# Patient Record
Sex: Female | Born: 2000 | Race: Black or African American | Hispanic: No | Marital: Single | Smoking: Never smoker
Health system: Southern US, Community
[De-identification: ages and names within clinical notes are randomized; demographics above are authoritative.]

## PROBLEM LIST (undated history)

## (undated) ENCOUNTER — Inpatient Hospital Stay (HOSPITAL_COMMUNITY): Payer: Self-pay

## (undated) DIAGNOSIS — Z789 Other specified health status: Secondary | ICD-10-CM

## (undated) HISTORY — PX: OTHER SURGICAL HISTORY: SHX169

## (undated) HISTORY — DX: Other specified health status: Z78.9

---
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
## 2021-07-11 ENCOUNTER — Emergency Department (HOSPITAL_BASED_OUTPATIENT_CLINIC_OR_DEPARTMENT_OTHER): Payer: Medicaid Other

## 2021-07-11 ENCOUNTER — Emergency Department (HOSPITAL_BASED_OUTPATIENT_CLINIC_OR_DEPARTMENT_OTHER)
Admission: EM | Admit: 2021-07-11 | Discharge: 2021-07-11 | Disposition: A | Discharge status: Home / Self Care | Payer: Medicaid Other | Attending: Emergency Medicine | Admitting: Emergency Medicine

## 2021-07-11 ENCOUNTER — Other Ambulatory Visit: Payer: Self-pay

## 2021-07-11 ENCOUNTER — Encounter (HOSPITAL_BASED_OUTPATIENT_CLINIC_OR_DEPARTMENT_OTHER): Payer: Self-pay

## 2021-07-11 DIAGNOSIS — W268XXA Contact with other sharp object(s), not elsewhere classified, initial encounter: Secondary | ICD-10-CM | POA: Insufficient documentation

## 2021-07-11 DIAGNOSIS — S68125A Partial traumatic metacarpophalangeal amputation of left ring finger, initial encounter: Secondary | ICD-10-CM | POA: Diagnosis not present

## 2021-07-11 DIAGNOSIS — S6992XA Unspecified injury of left wrist, hand and finger(s), initial encounter: Secondary | ICD-10-CM | POA: Diagnosis present

## 2021-07-11 DIAGNOSIS — Z23 Encounter for immunization: Secondary | ICD-10-CM | POA: Diagnosis not present

## 2021-07-11 DIAGNOSIS — S61215A Laceration without foreign body of left ring finger without damage to nail, initial encounter: Secondary | ICD-10-CM

## 2021-07-11 MED ORDER — TETANUS-DIPHTH-ACELL PERTUSSIS 5-2.5-18.5 LF-MCG/0.5 IM SUSY
0.5000 mL | PREFILLED_SYRINGE | Freq: Once | INTRAMUSCULAR | Status: AC
  Administered 2021-07-11: 0.5 mL via INTRAMUSCULAR
  Filled 2021-07-11: qty 0.5

## 2021-07-11 MED ORDER — CEFAZOLIN SODIUM-DEXTROSE 2-4 GM/100ML-% IV SOLN
2.0000 g | Freq: Once | INTRAVENOUS | Status: AC
  Administered 2021-07-11: 2 g via INTRAVENOUS
  Filled 2021-07-11: qty 100

## 2021-07-11 NOTE — ED Notes (Signed)
Sterile non-occlusive dressing applied to finger and hand. Pt. Has been instructed to not get wet and keep clean. Change dressing in 24 hours or so.

## 2021-07-11 NOTE — Discharge Instructions (Addendum)
It was a pleasure taking care of you!   Your x-ray was negative for fracture or dislocation.  You may take over the counter 600 mg Ibuprofen every 6 hours or 500 mg Tylenol every 6 hours as needed for pain for no more than 7 days. Keep the area clean and dry.   Attached is information for the on-call hand specialist, call tomorrow at 8 AM to find out what time your appointment in tomorrow with the hand specialist.   You may follow-up with your primary care provider as needed.  Return to the Emergency Department if you are experiencing increasing/worsening pain, swelling, color change, fever, or worsening symptoms.

## 2021-07-11 NOTE — ED Provider Notes (Signed)
[facility] EMERGENCY DEPT Provider Note   CSN: [id number] Arrival date & time: 07/11/21  1445     History  Chief Complaint  Patient presents with   Finger Injury    Haley Leblanc is a 21 y.o. female who presents to the emergency department with concerns for left ring finger injury onset prior to arrival.  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  Unsure of tetanus status.  No meds tried prior to arrival.  Denies pain anywhere else, fever, chills, color change.  Denies anticoagulant use.  Patient is right-hand dominant.  The history is provided by the patient. No language interpreter was used.       Home Medications Prior to Admission medications   Medication Sig Start Date End Date Taking? Authorizing Provider  diphenhydrAMINE (BENADRYL) 25 MG tablet Take 1 tablet (25 mg total) by mouth every 6 (six) hours as needed for itching or allergies. 08/12/20   Fayrene Helper, PA-C  famotidine (PEPCID) 20 MG tablet Take 1 tablet (20 mg total) by mouth 2 (two) times daily. 08/12/20   Fayrene Helper, PA-C      Allergies    Patient has no known allergies.    Review of Systems   Review of Systems  Constitutional:  Negative for chills and fever.  Musculoskeletal:  Positive for arthralgias. Negative for joint swelling.  Skin:  Positive for wound. Negative for color change.  Neurological:  Negative for weakness and numbness.  All other systems reviewed and are negative.   Physical Exam Updated Vital Signs BP (!) 144/91 (BP Location: Right Arm)   Pulse 92   Temp 98.5 F (36.9 C) (Temporal)   Resp 12   Ht 5\' 7"  (1.702 m)   Wt 65.3 kg   SpO2 100%   BMI 22.55 kg/m  Physical Exam Vitals and nursing note reviewed.  Constitutional:      General: She is not in acute distress.    Appearance: Normal appearance. She is not ill-appearing.  HENT:     Head: Normocephalic and atraumatic.     Right Ear: External ear normal.      Left Ear: External ear normal.  Eyes:     General: No scleral icterus. Cardiovascular:     Rate and Rhythm: Normal rate.  Pulmonary:     Effort: Pulmonary effort is normal.  Musculoskeletal:        General: Normal range of motion.     Cervical back: Normal range of motion and neck supple.  Skin:    General: Skin is warm and dry.     Capillary Refill: Capillary refill takes less than 2 seconds.     Findings: Laceration present.     Comments: Partial amputation of left ring finger. Nailbed intact.  Neurovascularly intact.  Neurological:     Mental Status: She is alert.         ED Results / Procedures / Treatments   Labs (all labs ordered are listed, but only abnormal results are displayed) Labs Reviewed - No data to display  EKG None  Radiology DG Finger Ring Left  Result Date: 07/11/2021 CLINICAL DATA:  left ring finger laceration EXAM: LEFT RING FINGER 2+V COMPARISON:  None Available. FINDINGS: There is no evidence of fracture or dislocation. There is no evidence of arthropathy or other focal bone abnormality. Distal second digit soft tissue defect. The defect appears to extend to the phalangeal tuft. No retained radiopaque foreign body.  IMPRESSION: 1. Distal second digit soft tissue defect. No retained radiopaque foreign body. 2.  No acute displaced fracture or dislocation. Electronically Signed   By: Tish Frederickson M.D.   On: 07/11/2021 15:48    Procedures Procedures    Medications Ordered in ED Medications  ceFAZolin (ANCEF) IVPB 2g/100 mL premix (2 g Intravenous New Bag/Given 07/11/21 1655)  Tdap (BOOSTRIX) injection 0.5 mL (0.5 mLs Intramuscular Given 07/11/21 1507)    ED Course/ Medical Decision Making/ A&P                            Medical Decision Making Amount and/or Complexity of Data Reviewed Radiology: ordered.  Risk Prescription drug management.   Patient presents with laceration noted to left ring finger onset prior to arrival.  Patient was  cutting onions at work when she cut the tip of her left ring finger.  Unsure of tetanus status.  Patient is not on anticoagulants at this time.  Vital signs stable, patient afebrile.  On exam patient with fingertip skin avulsion noted to the left ring finger without bone exposed.  Neurovascularly intact.  Nailbed intact. Unable to repair wound in the emergency department due to the extent of the avulsion.  Differential diagnosis includes open fracture, foreign body, dislocation, avulsion.    Imaging: I ordered imaging studies including left ring finger xray I independently visualized and interpreted imaging which showed:  1. Distal second digit soft tissue defect. No retained radiopaque  foreign body.  2.  No acute displaced fracture or dislocation.   I agree with the radiologist interpretation  Medications:  I ordered medication including ancef for antibiotic prophylaxis I have reviewed the patients home medicines and have made adjustments as needed  Area irrigated with water and betadine  Consultations: I requested consultation with the Hand Specialist, Dr. Susa Simmonds, and discussed lab and imaging findings as well as pertinent plan - they recommend: Ancef, wound care, and follow up in the office tomorrow.    Disposition: Presentation suspicious for fingertip skin avulsion. Doubt open fracture, dislocation, or foreign body at this time. Tetanus updated in the ED. due to mechanism of injury and inability to open approximate wound, laceration thoroughly irrigated with the dressing applied in the emergency department.  No foreign bodies noted.  After consideration of the diagnostic results and the patients response to treatment, I feel that the patient would benefit from Discharge home.  Discussed with patient importance of maintaining scheduled follow-up appointment with hand specialist tomorrow regarding today's ED visit.  Patient to follow-up in the ED for wound check sooner should there be signs  of dehiscence or infection. Pt is hemodynamically stable with no complaints prior to discharge. Supportive care measures and strict return precautions discussed with patient at bedside. Pt acknowledges and verbalizes understanding. Pt appears safe for discharge. Follow up as indicated in discharge paperwork.    This chart was dictated using voice recognition software, Dragon. Despite the best efforts of this provider to proofread and correct errors, errors may still occur which can change documentation meaning.  Final Clinical Impression(s) / ED Diagnoses Final diagnoses:  Injury of left ring finger, initial encounter  Laceration of left ring finger w/o foreign body w/o damage to nail, initial encounter    Rx / DC Orders ED Discharge Orders     None         Sereen Schaff A, PA-C 07/11/21 2100    Linwood Dibbles, MD 07/13/21 1524

## 2021-07-11 NOTE — ED Triage Notes (Signed)
Patient here POV from Work.  Patient was cutting Onions when she lacerated her Left Distal Fourth Digit off. Bleeding controlled at this Time and Wound irrigated. Unknown Tetanus Status.   NAD noted during Triage. A&Ox4. GCS 15. Ambulatory.

## 2021-12-29 ENCOUNTER — Encounter: Payer: Self-pay | Admitting: *Deleted

## 2022-01-24 NOTE — L&D Delivery Note (Signed)
OB/GYN Faculty Practice Delivery Note  Haley Leblanc is a 22 y.o. G1P0 s/p VD at [redacted]w[redacted]d. She was admitted for SROM/SOL.   ROM: 8h 66m with clear fluid GBS Status:  Negative/-- (05/22 1630) Maximum Maternal Temperature: 99.2  Labor Progress: Initial SVE: 6/90/0. She then progressed to complete.   Delivery Date/Time: 07/09/2022 @1543  Delivery: Called to room and patient was complete and pushing. Head delivered ROA. No nuchal cord present. Shoulder and body delivered in usual fashion. Infant with spontaneous cry, placed on mother's abdomen, dried and stimulated. Cord clamped x 2 after 1-minute delay, and cut by FOB. Cord blood drawn. Placenta delivered spontaneously with gentle cord traction. Fundus firm with massage and Pitocin. Labia, perineum, vagina, and cervix inspected. There was a non-hemostatic perineal abrasion repaired with good hemostasis.  Baby Weight: pending  Placenta: 3 vessel, intact. Sent to L&D Complications: None Lacerations: Right perineal abrasion repaired with 3.0 vicryl EBL: 252 mL Analgesia: Epidural   Infant:  APGAR (1 MIN): 9   APGAR (5 MINS): 9    Roniel Halloran Autry-Lott, DO OB Fellow, Faculty [employer], [facility] for [facility] 07/09/2022, 4:04 PM

## 2022-01-25 ENCOUNTER — Telehealth (INDEPENDENT_AMBULATORY_CARE_PROVIDER_SITE_OTHER): Payer: Medicaid Other

## 2022-01-25 DIAGNOSIS — Z3480 Encounter for supervision of other normal pregnancy, unspecified trimester: Secondary | ICD-10-CM | POA: Insufficient documentation

## 2022-01-25 DIAGNOSIS — Z3689 Encounter for other specified antenatal screening: Secondary | ICD-10-CM

## 2022-01-25 MED ORDER — BLOOD PRESSURE KIT DEVI: 1.0000 | 0 refills | Status: DC | PRN

## 2022-01-25 MED ORDER — GOJJI WEIGHT SCALE MISC: 1.0000 | 0 refills | Status: DC | PRN

## 2022-01-25 NOTE — Progress Notes (Signed)
New OB Intake  I connected with Haley Leblanc on 01/25/22 at 11:15 AM EST by MyChart Video Visit and verified that I am speaking with the correct person using two identifiers. Nurse is located at [facility] and pt is located at home.  I discussed the limitations, risks, security and privacy concerns of performing an evaluation and management service by telephone and the availability of in person appointments. I also discussed with the patient that there may be a patient responsible charge related to this service. The patient expressed understanding and agreed to proceed.  I explained I am completing New OB Intake today. I reviewed her allergies, medications, Medical/Surgical/OB history, and appropriate screenings. I informed her of [facility] services. [facility] information placed in AVS. Based on history, this is a low risk pregnancy.   Concerns addressed today: -Patient is not sure when was her last period first day cycle. Scheduled for a dating ultrasound on 02/09/2022 -Patient is interested in [place] and mom bby dyad.   Delivery Plans Plans to deliver at [facility]. Patient given information for [facility] Healthy Baby website for more information about Women's and [place]. Patient is interested in water birth. Offered upcoming OB visit with CNM to discuss further.  MyChart/Babyscripts MyChart access verified. I explained pt will have some visits in office and some virtually. Babyscripts instructions given and order placed. Patient verifies receipt of registration text/e-mail. Account successfully created and app downloaded.  Blood Pressure Cuff/Weight Scale Blood pressure cuff ordered for patient to pick-up from [employer]. Explained after first prenatal appt pt will check weekly and document in 65. Patient does not have weight scale; order sent to [place], patient may track weight weekly in Babyscripts.  Anatomy US Will schedule anatomy scan after dating ultrasound.    Labs Discussed Johnsie Cancel genetic screening with patient. Would like both Panorama and Horizon drawn at new OB visit. Routine prenatal labs needed.  COVID Vaccine Patient has had COVID vaccine.   Is patient a CenteringPregnancy candidate?  Declined Declined due to Enrolled in [facility]  Is patient a Mom+Baby Combined Care candidate?  Wants to be a part of it.  If accepted, Mom+Baby staff notified  Social Determinants of Health Food Insecurity: Patient denies food insecurity. WIC Referral: Patient is interested in referral to [facility].  Transportation: Patient denies transportation needs. Childcare: Discussed no children allowed at ultrasound appointments. Offered childcare services; patient declines childcare services at this time.  First visit review I reviewed new OB appt with patient. I explained they will have a provider visit that includes initial ob labs, OB Culture, GC/CH, and Hemoglobin A1C. Explained pt will be seen by Aletha Halim, MD  at first visit; encounter routed to appropriate provider. Explained that patient will be seen by pregnancy navigator following visit with provider.   Mariane Baumgarten, [place] 01/25/2022  11:09 AM

## 2022-02-02 ENCOUNTER — Encounter: Payer: Medicaid Other | Admitting: Obstetrics and Gynecology

## 2022-02-08 ENCOUNTER — Ambulatory Visit (INDEPENDENT_AMBULATORY_CARE_PROVIDER_SITE_OTHER): Payer: Medicaid Other | Admitting: Advanced Practice Midwife

## 2022-02-08 ENCOUNTER — Other Ambulatory Visit: Payer: Self-pay | Admitting: Obstetrics and Gynecology

## 2022-02-08 ENCOUNTER — Ambulatory Visit (INDEPENDENT_AMBULATORY_CARE_PROVIDER_SITE_OTHER): Payer: Medicaid Other

## 2022-02-08 DIAGNOSIS — Z3480 Encounter for supervision of other normal pregnancy, unspecified trimester: Secondary | ICD-10-CM | POA: Diagnosis not present

## 2022-02-08 DIAGNOSIS — Z3A18 18 weeks gestation of pregnancy: Secondary | ICD-10-CM

## 2022-02-08 DIAGNOSIS — N930 Postcoital and contact bleeding: Secondary | ICD-10-CM

## 2022-02-08 DIAGNOSIS — Z3492 Encounter for supervision of normal pregnancy, unspecified, second trimester: Secondary | ICD-10-CM

## 2022-02-08 NOTE — Patient Instructions (Signed)
  Considering Waterbirth? Guide for patients at Center for [employer] [place]) Why consider waterbirth? Gentle birth for babies  Less pain medicine used in labor  May allow for passive descent/less pushing  May reduce perineal tears  More mobility and instinctive maternal position changes  Increased maternal relaxation   Is waterbirth safe? What are the risks of infection, drowning or other complications? Infection:  Very low risk (3.7 % for tub vs 4.8% for bed)  7 in 8000 waterbirths with documented infection  Poorly cleaned equipment most common cause  Slightly lower group B strep transmission rate  Drowning  Maternal:  Very low risk  Related to seizures or fainting  Newborn:  Very low risk. No evidence of increased risk of respiratory problems in multiple large studies  Physiological protection from breathing under water  Avoid underwater birth if there are any fetal complications  Once baby's head is out of the water, keep it out.  Birth complication  Some reports of cord trauma, but risk decreased by bringing baby to surface gradually  No evidence of increased risk of shoulder dystocia. Mothers can usually change positions faster in water than in a bed, possibly aiding the maneuvers to free the shoulder.   There are 2 things you MUST do to have a waterbirth with [facility]: Attend a waterbirth class at [place] at [facility]   3rd Wednesday of every month from 7-9 pm (virtual during [place]) [employer] at [email or web address] or [email or web address] or by calling [phone] Bring [place] the certificate from the class to your prenatal appointment or send via [place] with a midwife at 36 weeks* to see if you can still plan a waterbirth and to sign the consent.   *We also recommend that you schedule as many of your prenatal visits with a midwife as possible.    Helpful information: You may want to bring a bathing suit top to the hospital  to wear during labor but this is optional.  All other supplies are provided by the hospital. Please arrive at the hospital with signs of active labor, and do not wait at home until late in labor. It takes 45 min- 1 hour for fetal monitoring, and check in to your room to take place, plus transport and filling of the waterbirth tub.    Things that would prevent you from having a waterbirth: Premature, <37wks  Previous cesarean birth  Presence of thick meconium-stained fluid  Multiple gestation (Twins, triplets, etc.)  Uncontrolled diabetes or gestational diabetes requiring medication  Hypertension diagnosed in pregnancy or preexisting hypertension (gestational hypertension, preeclampsia, or chronic hypertension) Fetal growth restriction (your baby measures less than 10th percentile on ultrasound) Heavy vaginal bleeding  Non-reassuring fetal heart rate  Active infection (MRSA, etc.). Group B Strep is NOT a contraindication for waterbirth.  If your labor has to be induced and induction method requires continuous monitoring of the baby's heart rate  Other risks/issues identified by your obstetrical provider   Please remember that birth is unpredictable. Under certain unforeseeable circumstances your provider may advise against giving birth in the tub. These decisions will be made on a case-by-case basis and with the safety of you and your baby as our highest priority.    Updated 04/28/21

## 2022-02-08 NOTE — Progress Notes (Signed)
Ultrasounds Results Note  SUBJECTIVE HPI:  [name] is a 22 y.o. G1P0 at unknown by unsure LMP who presents to [facility] for followup ultrasound results. The patient reports spotting after IC that has resolved. Denies abdominal pain.   Previous US None  Repeat ultrasound was performed earlier today.   No past medical history on file. Past Surgical History:  Procedure Laterality Date   right wrist surgery     Social History   Socioeconomic History   Marital status: Single    Spouse name: Not on file   Number of children: Not on file   Years of education: Not on file   Highest education level: Not on file  Occupational History   Not on file  Tobacco Use   Smoking status: Never    Passive exposure: Yes   Smokeless tobacco: Not on file  Substance and Sexual Activity   Alcohol use: Not Currently   Drug use: Not Currently   Sexual activity: Yes  Other Topics Concern   Not on file  Social History Narrative   Not on file   Social Determinants of Health   Financial Resource Strain: Not on file  Food Insecurity: Not on file  Transportation Needs: Not on file  Physical Activity: Not on file  Stress: Not on file  Social Connections: Not on file  Intimate Partner Violence: Not on file   Current Outpatient Medications on File Prior to Visit  Medication Sig Dispense Refill   Blood Pressure Monitoring (BLOOD PRESSURE KIT) DEVI 1 Device by Does not apply route as needed. 1 each 0   diphenhydrAMINE (BENADRYL) 25 MG tablet Take 1 tablet (25 mg total) by mouth every 6 (six) hours as needed for itching or allergies. (Patient not taking: Reported on 01/25/2022) 30 tablet 0   famotidine (PEPCID) 20 MG tablet Take 1 tablet (20 mg total) by mouth 2 (two) times daily. (Patient not taking: Reported on 01/25/2022) 10 tablet 0   Misc. Devices (GOJJI WEIGHT SCALE) MISC 1 Device by Does not apply route as needed. 1 each 0   Prenatal Vit-Fe Fumarate-FA  (MULTIVITAMIN-PRENATAL) 27-0.8 MG TABS tablet Take 1 tablet by mouth daily.     No current facility-administered medications on file prior to visit.   No Known Allergies  I have reviewed patient's Past Medical Hx, Surgical Hx, Family Hx, Social Hx, medications and allergies.   Review of Systems Review of Systems  Constitutional: Negative for fever and chills.  Gastrointestinal: Negative for abdominal pain.  Genitourinary: Negative for vaginal bleeding.  Musculoskeletal: Negative for back pain.  Neurological: Negative for dizziness and weakness.  Pelvic: deferred due to spotting not recent, resolved  Physical Exam  There were no vitals taken for this visit.  No LMP recorded. Patient is pregnant. GENERAL: Well-developed, well-nourished female in no acute distress.  HEENT: Normocephalic, atraumatic.   LUNGS: Effort normal ABDOMEN: Deferred HEART: Regular rate  SKIN: Warm, dry and without erythema PSYCH: Normal mood and affect NEURO: Alert and oriented x 4  LAB RESULTS No results found for this or any previous visit (from the past 24 hour(s)).  IMAGING 18.1 week live SIUP. Placenta posterior, high. No Previa. Cervix closed, 3.56 cm  ASSESSMENT 1. [redacted] weeks gestation of pregnancy   2. Normal IUP (intrauterine pregnancy) on prenatal ultrasound, second trimester   3. Uncertain dates, antepartum, second trimester   4. Supervision of other normal pregnancy, antepartum   5. Postcoital bleeding     PLAN Scheduled  for Mom-Baby Combined Care Blood type unknown but too much time has passed since spotting to offer Rho. Will check ABO/Rh with NOB labs.  Patient advised to continue taking prenatal vitamins Go to MAU as needed for heavy bleeding, abdominal pain or fever greater than 100.4.  [place], [place] 02/08/2022 8:37 PM

## 2022-02-09 ENCOUNTER — Other Ambulatory Visit: Payer: Medicaid Other

## 2022-02-11 ENCOUNTER — Ambulatory Visit (INDEPENDENT_AMBULATORY_CARE_PROVIDER_SITE_OTHER): Payer: Medicaid Other | Admitting: Family Medicine

## 2022-02-11 ENCOUNTER — Other Ambulatory Visit (HOSPITAL_COMMUNITY)
Admission: RE | Admit: 2022-02-11 | Discharge: 2022-02-11 | Disposition: A | Discharge status: Home / Self Care | Payer: Medicaid Other | Source: Ambulatory Visit | Attending: Family Medicine | Admitting: Family Medicine

## 2022-02-11 ENCOUNTER — Other Ambulatory Visit: Payer: Self-pay

## 2022-02-11 VITALS — BP 113/74 | HR 83 | Wt 144.4 lb

## 2022-02-11 DIAGNOSIS — Z3480 Encounter for supervision of other normal pregnancy, unspecified trimester: Secondary | ICD-10-CM | POA: Diagnosis present

## 2022-02-11 NOTE — Patient Instructions (Signed)

## 2022-02-11 NOTE — Progress Notes (Signed)
     Subjective:   Haley Leblanc is a 22 y.o. G1P0 at [redacted]w[redacted]d by midtrimester ultrasound being seen today for her first obstetrical visit.  Her obstetrical history is significant for  n/a . Patient does intend to breast feed. Pregnancy history fully reviewed.  Patient reports no complaints.  HISTORY: OB History  Gravida Para Term Preterm AB Living  1 0 0 0 0 0  SAB IAB Ectopic Multiple Live Births  0 0 0 0 0    # Outcome Date GA Lbr Len/2nd Weight Sex Delivery Anes PTL Lv  1 Current              Last pap smear: No results found for: "DIAGPAP", "HPV", "[employer]" N/a  History reviewed. No pertinent past medical history. Past Surgical History:  Procedure Laterality Date   right wrist surgery     History reviewed. No pertinent family history. Social History   Tobacco Use   Smoking status: Never    Passive exposure: Yes  Substance Use Topics   Alcohol use: Not Currently   Drug use: Not Currently   No Known Allergies Current Outpatient Medications on File Prior to Visit  Medication Sig Dispense Refill   Prenatal Vit-Fe Fumarate-FA (MULTIVITAMIN-PRENATAL) 27-0.8 MG TABS tablet Take 1 tablet by mouth daily.     No current facility-administered medications on file prior to visit.     Exam   Vitals:   02/11/22 0931  BP: 113/74  Pulse: 83  Weight: 144 lb 6.4 oz (65.5 kg)   Fetal Heart Rate (bpm): 152  System: General: well-developed, well-nourished female in no acute distress   Skin: normal coloration and turgor, no rashes   Neurologic: oriented, normal, negative, normal mood   Extremities: normal strength, tone, and muscle mass, ROM of all joints is normal   HEENT PERRLA, extraocular movement intact and sclera clear, anicteric   Neck supple and no masses   Respiratory:  no respiratory distress      Assessment:   Pregnancy: G1P0 Patient Active Problem List   Diagnosis Date Noted   Supervision of other normal pregnancy, antepartum 01/25/2022     Plan:   1. Supervision of other normal pregnancy, antepartum BP and FHR normal Initial labs drawn. AFP today as well Continue prenatal vitamins. Genetic Screening discussed, NIPS: ordered. Ultrasound discussed; fetal anatomic survey: ordered. Problem list reviewed and updated. The nature of [facility] clinic was explained to patient; Voiced they may need to be seen by other [facility] providers which includes family medicine physicians, OB GYNs, and APPs. Delivery will hopefully be with one of the [facility] providers or another [facility] Medicine physician and we cannot promise this at this time.  Discussed there are [facility] staff in the hospital 24-7 and they understand and support this model and there is a likelihood one of these providers will catch their baby.  We also discussed that the service includes learners (residents, student) and they will be involved in the care team.  - [facility] AMB BABYSCRIPTS SCHEDULE OPTIMIZATION - [place] MFM OB COMP + 14 WK; Future   Routine obstetric precautions reviewed. Return in 4 weeks (on 03/11/2022) for [facility] patient, ob visit.

## 2022-02-11 NOTE — Addendum Note (Signed)
Addended by: Georgia Lopes on: 02/11/2022 12:24 PM   Modules accepted: Orders

## 2022-02-13 LAB — HEMOGLOBIN A1C
Est. average glucose Bld gHb Est-mCnc: 114 mg/dL
Hgb A1c MFr Bld: 5.6 % (ref 4.8–5.6)

## 2022-02-13 LAB — AFP, SERUM, OPEN SPINA BIFIDA
AFP MoM: 0.83
AFP Value: 44.5 ng/mL
Gest. Age on Collection Date: 18.6 weeks
Maternal Age At EDD: 20.8 yr
OSBR Risk 1 IN: 10000
Test Results:: NEGATIVE
Weight: 144 [lb_av]

## 2022-02-13 LAB — CBC/D/PLT+RPR+RH+ABO+RUBIGG...
Antibody Screen: NEGATIVE
Basophils Absolute: 0 10*3/uL (ref 0.0–0.2)
Basos: 1 %
EOS (ABSOLUTE): 0.1 10*3/uL (ref 0.0–0.4)
Eos: 1 %
HCV Ab: NONREACTIVE
HIV Screen 4th Generation wRfx: NONREACTIVE
Hematocrit: 35.1 % (ref 34.0–46.6)
Hemoglobin: 11.9 g/dL (ref 11.1–15.9)
Hepatitis B Surface Ag: NEGATIVE
Immature Grans (Abs): 0 10*3/uL (ref 0.0–0.1)
Immature Granulocytes: 0 %
Lymphocytes Absolute: 1.2 10*3/uL (ref 0.7–3.1)
Lymphs: 21 %
MCH: 28.3 pg (ref 26.6–33.0)
MCHC: 33.9 g/dL (ref 31.5–35.7)
MCV: 84 fL (ref 79–97)
Monocytes Absolute: 0.3 10*3/uL (ref 0.1–0.9)
Monocytes: 6 %
Neutrophils Absolute: 4 10*3/uL (ref 1.4–7.0)
Neutrophils: 71 %
Platelets: 184 10*3/uL (ref 150–450)
RBC: 4.2 x10E6/uL (ref 3.77–5.28)
RDW: 16.7 % — ABNORMAL HIGH (ref 11.7–15.4)
RPR Ser Ql: NONREACTIVE
Rh Factor: POSITIVE
Rubella Antibodies, IGG: 6.53 index (ref >0.99)
WBC: 5.6 10*3/uL (ref 3.4–10.8)

## 2022-02-13 LAB — HCV INTERPRETATION

## 2022-02-13 LAB — URINE CULTURE, OB REFLEX

## 2022-02-13 LAB — CULTURE, OB URINE

## 2022-02-14 LAB — GC/CHLAMYDIA PROBE AMP (~~LOC~~) NOT AT ARMC
Chlamydia: NEGATIVE
Comment: NEGATIVE
Comment: NORMAL
Neisseria Gonorrhea: NEGATIVE

## 2022-02-17 ENCOUNTER — Encounter: Payer: Self-pay | Admitting: *Deleted

## 2022-02-18 LAB — PANORAMA PRENATAL TEST FULL PANEL:PANORAMA TEST PLUS 5 ADDITIONAL MICRODELETIONS: FETAL FRACTION: 12.8

## 2022-02-22 LAB — HORIZON CUSTOM: REPORT SUMMARY: POSITIVE — AB

## 2022-02-23 ENCOUNTER — Encounter: Payer: Self-pay | Admitting: Family Medicine

## 2022-02-23 DIAGNOSIS — Z148 Genetic carrier of other disease: Secondary | ICD-10-CM | POA: Insufficient documentation

## 2022-02-25 ENCOUNTER — Other Ambulatory Visit: Payer: Self-pay | Admitting: *Deleted

## 2022-02-25 DIAGNOSIS — Z3480 Encounter for supervision of other normal pregnancy, unspecified trimester: Secondary | ICD-10-CM

## 2022-02-25 DIAGNOSIS — Z148 Genetic carrier of other disease: Secondary | ICD-10-CM

## 2022-03-02 ENCOUNTER — Ambulatory Visit: Payer: Medicaid Other

## 2022-03-02 ENCOUNTER — Ambulatory Visit: Payer: Medicaid Other | Attending: Family Medicine

## 2022-03-02 ENCOUNTER — Other Ambulatory Visit: Payer: Self-pay

## 2022-03-02 ENCOUNTER — Encounter: Payer: Self-pay | Admitting: Family Medicine

## 2022-03-02 ENCOUNTER — Other Ambulatory Visit: Payer: Self-pay | Admitting: Family Medicine

## 2022-03-02 VITALS — BP 127/66 | HR 79

## 2022-03-02 DIAGNOSIS — Z3A21 21 weeks gestation of pregnancy: Secondary | ICD-10-CM | POA: Diagnosis not present

## 2022-03-02 DIAGNOSIS — O365920 Maternal care for other known or suspected poor fetal growth, second trimester, not applicable or unspecified: Secondary | ICD-10-CM

## 2022-03-02 DIAGNOSIS — Z363 Encounter for antenatal screening for malformations: Secondary | ICD-10-CM | POA: Insufficient documentation

## 2022-03-02 DIAGNOSIS — Z3480 Encounter for supervision of other normal pregnancy, unspecified trimester: Secondary | ICD-10-CM | POA: Diagnosis present

## 2022-03-02 DIAGNOSIS — Z148 Genetic carrier of other disease: Secondary | ICD-10-CM

## 2022-03-02 DIAGNOSIS — Z362 Encounter for other antenatal screening follow-up: Secondary | ICD-10-CM

## 2022-03-02 DIAGNOSIS — O26872 Cervical shortening, second trimester: Secondary | ICD-10-CM

## 2022-03-02 DIAGNOSIS — O283 Abnormal ultrasonic finding on antenatal screening of mother: Secondary | ICD-10-CM | POA: Insufficient documentation

## 2022-03-02 HISTORY — DX: Cervical shortening, second trimester: O26.872

## 2022-03-09 ENCOUNTER — Encounter: Payer: Self-pay | Admitting: Family Medicine

## 2022-03-09 ENCOUNTER — Ambulatory Visit (INDEPENDENT_AMBULATORY_CARE_PROVIDER_SITE_OTHER): Payer: Medicaid Other | Admitting: Family Medicine

## 2022-03-09 VITALS — BP 128/80 | HR 78 | Wt 150.0 lb

## 2022-03-09 DIAGNOSIS — O283 Abnormal ultrasonic finding on antenatal screening of mother: Secondary | ICD-10-CM

## 2022-03-09 DIAGNOSIS — Z3A22 22 weeks gestation of pregnancy: Secondary | ICD-10-CM

## 2022-03-09 DIAGNOSIS — Z3480 Encounter for supervision of other normal pregnancy, unspecified trimester: Secondary | ICD-10-CM

## 2022-03-09 DIAGNOSIS — Z148 Genetic carrier of other disease: Secondary | ICD-10-CM

## 2022-03-09 DIAGNOSIS — O26872 Cervical shortening, second trimester: Secondary | ICD-10-CM

## 2022-03-09 NOTE — Patient Instructions (Signed)

## 2022-03-09 NOTE — Progress Notes (Signed)
   Subjective:  Haley Leblanc is a 22 y.o. G1P0 at [redacted]w[redacted]d being seen today for ongoing prenatal care.  She is currently monitored for the following issues for this low-risk pregnancy and has Supervision of other normal pregnancy, antepartum; Carrier of spinal muscular atrophy; Short cervix during pregnancy in second trimester; and Echogenic intracardiac focus of fetus on prenatal ultrasound on their problem list.  Patient reports no complaints.  Contractions: Not present. Vag. Bleeding: None.  Movement: Present. Denies leaking of fluid.   The following portions of the patient's history were reviewed and updated as appropriate: allergies, current medications, past family history, past medical history, past social history, past surgical history and problem list. Problem list updated.  Objective:   Vitals:   03/09/22 1619  BP: 128/80  Pulse: 78  Weight: 150 lb (68 kg)    Fetal Status: Fetal Heart Rate (bpm): 145   Movement: Present     General:  Alert, oriented and cooperative. Patient is in no acute distress.  Skin: Skin is warm and dry. No rash noted.   Cardiovascular: Normal heart rate noted  Respiratory: Normal respiratory effort, no problems with respiration noted  Abdomen: Soft, gravid, appropriate for gestational age. Pain/Pressure: Absent     Pelvic: Vag. Bleeding: None     Cervical exam deferred        Extremities: Normal range of motion.     Mental Status: Normal mood and affect. Normal behavior. Normal judgment and thought content.   Urinalysis:      Assessment and Plan:  Pregnancy: G1P0 at [redacted]w[redacted]d  1. Supervision of other normal pregnancy, antepartum BP and FHR normal  2. Short cervix during pregnancy in second trimester 2.7 cm without funneling on anatomy scan Following w MFM  3. Echogenic intracardiac focus of fetus on prenatal ultrasound Low risk NIPT  4. Carrier of spinal muscular atrophy Partner accepts testing today  Preterm labor symptoms and general  obstetric precautions including but not limited to vaginal bleeding, contractions, leaking of fluid and fetal movement were reviewed in detail with the patient. Please refer to After Visit Summary for other counseling recommendations.  Return in 4 weeks (on 04/06/2022) for [facility] patient, ob visit.   Clarnce Flock, MD

## 2022-03-10 ENCOUNTER — Encounter: Payer: Self-pay | Admitting: Family Medicine

## 2022-03-30 ENCOUNTER — Telehealth: Payer: Self-pay | Admitting: Lactation Services

## 2022-03-30 NOTE — Telephone Encounter (Signed)
Called patient to inform her that Partner Bonnee Quin Horizon Screening is negative. Received message that call cannot be completed at this time, My Chart message sent.

## 2022-03-31 ENCOUNTER — Ambulatory Visit: Payer: Medicaid Other | Attending: Obstetrics

## 2022-03-31 ENCOUNTER — Ambulatory Visit: Payer: Medicaid Other

## 2022-03-31 ENCOUNTER — Other Ambulatory Visit: Payer: Self-pay | Admitting: Obstetrics

## 2022-03-31 VITALS — BP 126/66 | HR 105

## 2022-03-31 DIAGNOSIS — O365920 Maternal care for other known or suspected poor fetal growth, second trimester, not applicable or unspecified: Secondary | ICD-10-CM | POA: Insufficient documentation

## 2022-03-31 DIAGNOSIS — Z3A25 25 weeks gestation of pregnancy: Secondary | ICD-10-CM | POA: Diagnosis not present

## 2022-03-31 DIAGNOSIS — Z148 Genetic carrier of other disease: Secondary | ICD-10-CM | POA: Diagnosis not present

## 2022-03-31 DIAGNOSIS — Z3480 Encounter for supervision of other normal pregnancy, unspecified trimester: Secondary | ICD-10-CM

## 2022-03-31 DIAGNOSIS — O321XX0 Maternal care for breech presentation, not applicable or unspecified: Secondary | ICD-10-CM | POA: Diagnosis not present

## 2022-03-31 DIAGNOSIS — O35BXX0 Maternal care for other (suspected) fetal abnormality and damage, fetal cardiac anomalies, not applicable or unspecified: Secondary | ICD-10-CM

## 2022-03-31 DIAGNOSIS — Z362 Encounter for other antenatal screening follow-up: Secondary | ICD-10-CM

## 2022-03-31 DIAGNOSIS — Z3686 Encounter for antenatal screening for cervical length: Secondary | ICD-10-CM

## 2022-04-05 ENCOUNTER — Encounter: Payer: Self-pay | Admitting: Family Medicine

## 2022-04-05 ENCOUNTER — Ambulatory Visit (INDEPENDENT_AMBULATORY_CARE_PROVIDER_SITE_OTHER): Payer: Medicaid Other | Admitting: Family Medicine

## 2022-04-05 ENCOUNTER — Other Ambulatory Visit: Payer: Self-pay

## 2022-04-05 ENCOUNTER — Other Ambulatory Visit (HOSPITAL_COMMUNITY)
Admission: RE | Admit: 2022-04-05 | Discharge: 2022-04-05 | Disposition: A | Discharge status: Home / Self Care | Payer: Medicaid Other | Source: Ambulatory Visit | Attending: Family Medicine | Admitting: Family Medicine

## 2022-04-05 VITALS — BP 128/73 | HR 87 | Wt 151.0 lb

## 2022-04-05 DIAGNOSIS — O26892 Other specified pregnancy related conditions, second trimester: Secondary | ICD-10-CM | POA: Insufficient documentation

## 2022-04-05 DIAGNOSIS — O283 Abnormal ultrasonic finding on antenatal screening of mother: Secondary | ICD-10-CM

## 2022-04-05 DIAGNOSIS — Z3A26 26 weeks gestation of pregnancy: Secondary | ICD-10-CM

## 2022-04-05 DIAGNOSIS — O26872 Cervical shortening, second trimester: Secondary | ICD-10-CM

## 2022-04-05 DIAGNOSIS — Z3480 Encounter for supervision of other normal pregnancy, unspecified trimester: Secondary | ICD-10-CM

## 2022-04-05 DIAGNOSIS — Z148 Genetic carrier of other disease: Secondary | ICD-10-CM

## 2022-04-05 DIAGNOSIS — N898 Other specified noninflammatory disorders of vagina: Secondary | ICD-10-CM

## 2022-04-05 NOTE — Progress Notes (Signed)
   Subjective:  Haley Leblanc is a 22 y.o. G1P0 at [redacted]w[redacted]d being seen today for ongoing prenatal care.  She is currently monitored for the following issues for this low-risk pregnancy and has Supervision of other normal pregnancy, antepartum; Carrier of spinal muscular atrophy; Short cervix during pregnancy in second trimester; and Echogenic intracardiac focus of fetus on prenatal ultrasound on their problem list.  Patient reports no complaints.  Contractions: Not present. Vag. Bleeding: None.  Movement: Present. Denies leaking of fluid.   The following portions of the patient's history were reviewed and updated as appropriate: allergies, current medications, past family history, past medical history, past social history, past surgical history and problem list. Problem list updated.  Objective:   Vitals:   04/05/22 1544  BP: 128/73  Pulse: 87  Weight: 151 lb (68.5 kg)    Fetal Status: Fetal Heart Rate (bpm): 137   Movement: Present     General:  Alert, oriented and cooperative. Patient is in no acute distress.  Skin: Skin is warm and dry. No rash noted.   Cardiovascular: Normal heart rate noted  Respiratory: Normal respiratory effort, no problems with respiration noted  Abdomen: Soft, gravid, appropriate for gestational age. Pain/Pressure: Absent     Pelvic: Vag. Bleeding: None     Cervical exam deferred        Extremities: Normal range of motion.     Mental Status: Normal mood and affect. Normal behavior. Normal judgment and thought content.   Urinalysis:      Assessment and Plan:  Pregnancy: G1P0 at [redacted]w[redacted]d  1. Supervision of other normal pregnancy, antepartum BP and FHR normal Discussed fasting labs for next visit Would like nexplanon for contraception  2. Short cervix during pregnancy in second trimester Stable on most recent TVUS on 03/31/2022, further exams as clinically indicated  3. Echogenic intracardiac focus of fetus on prenatal ultrasound Low risk NIPT  4. Carrier  of spinal muscular atrophy Partner testing given last visit, partner is negative  Preterm labor symptoms and general obstetric precautions including but not limited to vaginal bleeding, contractions, leaking of fluid and fetal movement were reviewed in detail with the patient. Please refer to After Visit Summary for other counseling recommendations.  Return in about 2 weeks (around 04/19/2022) for [facility] patient, ob visit.   Clarnce Flock, MD

## 2022-04-06 LAB — CERVICOVAGINAL ANCILLARY ONLY
Bacterial Vaginitis (gardnerella): POSITIVE — AB
Candida Glabrata: NEGATIVE
Candida Vaginitis: NEGATIVE
Chlamydia: NEGATIVE
Comment: NEGATIVE
Comment: NEGATIVE
Comment: NEGATIVE
Comment: NEGATIVE
Comment: NEGATIVE
Comment: NORMAL
Neisseria Gonorrhea: NEGATIVE
Trichomonas: NEGATIVE

## 2022-04-06 MED ORDER — METRONIDAZOLE 0.75 % VA GEL: 1.0000 | Freq: Every day | VAGINAL | 0 refills | Status: AC

## 2022-04-06 NOTE — Addendum Note (Signed)
Addended by: Clayton Lefort on: 04/06/2022 04:31 PM   Modules accepted: Orders

## 2022-04-14 ENCOUNTER — Encounter: Payer: Self-pay | Admitting: Advanced Practice Midwife

## 2022-04-14 DIAGNOSIS — N898 Other specified noninflammatory disorders of vagina: Secondary | ICD-10-CM

## 2022-04-15 MED ORDER — FLUCONAZOLE 150 MG PO TABS: 150.0000 mg | ORAL_TABLET | Freq: Once | ORAL | 0 refills | Status: AC

## 2022-04-18 ENCOUNTER — Other Ambulatory Visit: Payer: Self-pay

## 2022-04-18 ENCOUNTER — Other Ambulatory Visit: Payer: Medicaid Other

## 2022-04-18 ENCOUNTER — Ambulatory Visit (INDEPENDENT_AMBULATORY_CARE_PROVIDER_SITE_OTHER): Payer: Medicaid Other | Admitting: Family Medicine

## 2022-04-18 VITALS — BP 116/80 | HR 89 | Wt 154.0 lb

## 2022-04-18 DIAGNOSIS — O26872 Cervical shortening, second trimester: Secondary | ICD-10-CM

## 2022-04-18 DIAGNOSIS — Z3480 Encounter for supervision of other normal pregnancy, unspecified trimester: Secondary | ICD-10-CM

## 2022-04-18 DIAGNOSIS — Z3A28 28 weeks gestation of pregnancy: Secondary | ICD-10-CM

## 2022-04-18 DIAGNOSIS — Z23 Encounter for immunization: Secondary | ICD-10-CM | POA: Diagnosis not present

## 2022-04-18 DIAGNOSIS — Z148 Genetic carrier of other disease: Secondary | ICD-10-CM

## 2022-04-18 DIAGNOSIS — O26873 Cervical shortening, third trimester: Secondary | ICD-10-CM

## 2022-04-18 NOTE — Patient Instructions (Signed)
[email or web address]

## 2022-04-18 NOTE — Progress Notes (Signed)
   PRENATAL VISIT NOTE  Subjective:  Haley Leblanc is a 22 y.o. G1P0 at [redacted]w[redacted]d being seen today for ongoing prenatal care.  She is currently monitored for the following issues for this low-risk pregnancy and has Supervision of other normal pregnancy, antepartum; Carrier of spinal muscular atrophy; Short cervix during pregnancy in second trimester; and Echogenic intracardiac focus of fetus on prenatal ultrasound on their problem list.  Patient reports no complaints.  Contractions: Not present. Vag. Bleeding: None.  Movement: Present. Denies leaking of fluid.   The following portions of the patient's history were reviewed and updated as appropriate: allergies, current medications, past family history, past medical history, past social history, past surgical history and problem list.   Objective:   Vitals:   04/18/22 0933  BP: 116/80  Pulse: 89  Weight: 154 lb (69.9 kg)    Fetal Status: Fetal Heart Rate (bpm): 138 Fundal Height: 26 cm Movement: Present     General:  Alert, oriented and cooperative. Patient is in no acute distress.  Skin: Skin is warm and dry. No rash noted.   Cardiovascular: Normal heart rate noted  Respiratory: Normal respiratory effort, no problems with respiration noted  Abdomen: Soft, gravid, appropriate for gestational age.  Pain/Pressure: Present     Pelvic: Cervical exam deferred        Extremities: Normal range of motion.     Mental Status: Normal mood and affect. Normal behavior. Normal judgment and thought content.   Assessment and Plan:  Pregnancy: G1P0 at [redacted]w[redacted]d 1. Supervision of other normal pregnancy, antepartum Continue routine prenatal care. TDaP given 28 week labs today Childbirth classes reviewed  2. Carrier of spinal muscular atrophy Negative partner testing  3. Short cervix during pregnancy in second trimester Resolved  Preterm labor symptoms and general obstetric precautions including but not limited to vaginal bleeding, contractions,  leaking of fluid and fetal movement were reviewed in detail with the patient. Please refer to After Visit Summary for other counseling recommendations.   Return in 2 weeks (on 05/02/2022) for Mom+Baby Care.  Future Appointments  Date Time Provider [place]  05/03/2022 10:55 AM Clarnce Flock, MD [facility]  05/17/2022  1:15 PM Donnamae Jude, MD [facility]  06/01/2022  1:55 PM Caren Macadam, MD [facility]    Donnamae Jude, MD

## 2022-04-19 LAB — CBC
Hematocrit: 33.8 % — ABNORMAL LOW (ref 34.0–46.6)
Hemoglobin: 11.8 g/dL (ref 11.1–15.9)
MCH: 30.6 pg (ref 26.6–33.0)
MCHC: 34.9 g/dL (ref 31.5–35.7)
MCV: 88 fL (ref 79–97)
Platelets: 161 10*3/uL (ref 150–450)
RBC: 3.85 x10E6/uL (ref 3.77–5.28)
RDW: 13.9 % (ref 11.7–15.4)
WBC: 6.1 10*3/uL (ref 3.4–10.8)

## 2022-04-19 LAB — GLUCOSE TOLERANCE, 2 HOURS W/ 1HR
Glucose, 1 hour: 150 mg/dL (ref 70–179)
Glucose, 2 hour: 100 mg/dL (ref 70–152)
Glucose, Fasting: 80 mg/dL (ref 70–91)

## 2022-04-19 LAB — RPR: RPR Ser Ql: NONREACTIVE

## 2022-04-19 LAB — HIV ANTIBODY (ROUTINE TESTING W REFLEX): HIV Screen 4th Generation wRfx: NONREACTIVE

## 2022-04-26 ENCOUNTER — Encounter: Payer: Self-pay | Admitting: Obstetrics and Gynecology

## 2022-05-03 ENCOUNTER — Encounter: Payer: Medicaid Other | Admitting: Family Medicine

## 2022-05-05 ENCOUNTER — Ambulatory Visit (INDEPENDENT_AMBULATORY_CARE_PROVIDER_SITE_OTHER): Payer: Medicaid Other | Admitting: Family Medicine

## 2022-05-05 ENCOUNTER — Other Ambulatory Visit: Payer: Self-pay

## 2022-05-05 VITALS — BP 110/73 | HR 103 | Wt 155.4 lb

## 2022-05-05 DIAGNOSIS — Z3480 Encounter for supervision of other normal pregnancy, unspecified trimester: Secondary | ICD-10-CM

## 2022-05-05 NOTE — Patient Instructions (Signed)

## 2022-05-05 NOTE — Progress Notes (Signed)
   Subjective:  Haley Leblanc is a 22 y.o. G1P0 at [redacted]w[redacted]d being seen today for ongoing prenatal care.  She is currently monitored for the following issues for this low-risk pregnancy and has Supervision of other normal pregnancy, antepartum; Carrier of spinal muscular atrophy; Short cervix during pregnancy in second trimester; and Echogenic intracardiac focus of fetus on prenatal ultrasound on their problem list.  Patient reports no complaints.  Contractions: Not present. Vag. Bleeding: None.  Movement: Present. Denies leaking of fluid.   The following portions of the patient's history were reviewed and updated as appropriate: allergies, current medications, past family history, past medical history, past social history, past surgical history and problem list. Problem list updated.  Objective:   Vitals:   05/05/22 1353  BP: 110/73  Pulse: (!) 103  Weight: 155 lb 6.4 oz (70.5 kg)    Fetal Status: Fetal Heart Rate (bpm): 140 Fundal Height: 30 cm Movement: Present     General:  Alert, oriented and cooperative. Patient is in no acute distress.  Skin: Skin is warm and dry. No rash noted.   Cardiovascular: Normal heart rate noted  Respiratory: Normal respiratory effort, no problems with respiration noted  Abdomen: Soft, gravid, appropriate for gestational age. Pain/Pressure: Absent     Pelvic: Vag. Bleeding: None     Cervical exam deferred        Extremities: Normal range of motion.  Edema: Trace  Mental Status: Normal mood and affect. Normal behavior. Normal judgment and thought content.   Urinalysis:      Assessment and Plan:  Pregnancy: G1P0 at [redacted]w[redacted]d  1. Supervision of other normal pregnancy, antepartum BP and FHR normal FH 30 cm  Preterm labor symptoms and general obstetric precautions including but not limited to vaginal bleeding, contractions, leaking of fluid and fetal movement were reviewed in detail with the patient. Please refer to After Visit Summary for other counseling  recommendations.  Return in 2 weeks (on 05/19/2022) for [facility] patient, ob visit.   Venora Maples, MD

## 2022-05-17 ENCOUNTER — Encounter: Payer: Medicaid Other | Admitting: Family Medicine

## 2022-05-19 ENCOUNTER — Encounter: Payer: Self-pay | Admitting: Family Medicine

## 2022-05-19 ENCOUNTER — Ambulatory Visit (INDEPENDENT_AMBULATORY_CARE_PROVIDER_SITE_OTHER): Payer: Medicaid Other | Admitting: Family Medicine

## 2022-05-19 VITALS — BP 114/79 | HR 99 | Wt 158.0 lb

## 2022-05-19 DIAGNOSIS — O26872 Cervical shortening, second trimester: Secondary | ICD-10-CM

## 2022-05-19 DIAGNOSIS — Z148 Genetic carrier of other disease: Secondary | ICD-10-CM

## 2022-05-19 DIAGNOSIS — Z3480 Encounter for supervision of other normal pregnancy, unspecified trimester: Secondary | ICD-10-CM

## 2022-05-19 DIAGNOSIS — O283 Abnormal ultrasonic finding on antenatal screening of mother: Secondary | ICD-10-CM

## 2022-05-19 DIAGNOSIS — Z3A32 32 weeks gestation of pregnancy: Secondary | ICD-10-CM

## 2022-05-19 NOTE — Progress Notes (Signed)
   PRENATAL VISIT NOTE  Subjective:  Haley Leblanc is a 22 y.o. G1P0 at [redacted]w[redacted]d being seen today for ongoing prenatal care.  She is currently monitored for the following issues for this low-risk pregnancy and has Supervision of other normal pregnancy, antepartum; Carrier of spinal muscular atrophy; Short cervix during pregnancy in second trimester; and Echogenic intracardiac focus of fetus on prenatal ultrasound on their problem list.  Patient reports no complaints.  Contractions: Irritability. Vag. Bleeding: None.  Movement: Present. Denies leaking of fluid.   The following portions of the patient's history were reviewed and updated as appropriate: allergies, current medications, past family history, past medical history, past social history, past surgical history and problem list.   Objective:   Vitals:   05/19/22 1342  BP: 114/79  Pulse: 99  Weight: 158 lb (71.7 kg)    Fetal Status: Fetal Heart Rate (bpm): 140 Fundal Height: 31 cm Movement: Present     General:  Alert, oriented and cooperative. Patient is in no acute distress.  Skin: Skin is warm and dry. No rash noted.   Cardiovascular: Normal heart rate noted  Respiratory: Normal respiratory effort, no problems with respiration noted  Abdomen: Soft, gravid, appropriate for gestational age.  Pain/Pressure: Present     Pelvic: Cervical exam deferred        Extremities: Normal range of motion.  Edema: None  Mental Status: Normal mood and affect. Normal behavior. Normal judgment and thought content.   Assessment and Plan:  Pregnancy: G1P0 at [redacted]w[redacted]d 1. Supervision of other normal pregnancy, antepartum Continue routine prenatal care.  2. Short cervix during pregnancy in second trimester No s/sx's of PTL  3. Echogenic intracardiac focus of fetus on prenatal ultrasound Normal NIPT  4. Carrier of spinal muscular atrophy Partner is negative  Preterm labor symptoms and general obstetric precautions including but not limited to  vaginal bleeding, contractions, leaking of fluid and fetal movement were reviewed in detail with the patient. Please refer to After Visit Summary for other counseling recommendations.   Return in 2 weeks (on 06/02/2022) for Mom+Baby Care.  Future Appointments  Date Time Provider Department Center  06/01/2022  1:55 PM Federico Flake, MD [facility]  06/15/2022  1:55 PM Federico Flake, MD [facility]  06/27/2022  2:35 PM Federico Flake, MD [facility]  07/04/2022  2:35 PM Federico Flake, MD [facility]  07/11/2022  1:15 PM [facility]  07/11/2022  2:35 PM Federico Flake, MD [facility]    Reva Bores, MD

## 2022-05-19 NOTE — Patient Instructions (Signed)
[email or web address] 

## 2022-06-01 ENCOUNTER — Other Ambulatory Visit: Payer: Self-pay

## 2022-06-01 ENCOUNTER — Ambulatory Visit (INDEPENDENT_AMBULATORY_CARE_PROVIDER_SITE_OTHER): Payer: Medicaid Other | Admitting: Family Medicine

## 2022-06-01 VITALS — BP 119/80 | HR 90 | Wt 161.6 lb

## 2022-06-01 DIAGNOSIS — O26873 Cervical shortening, third trimester: Secondary | ICD-10-CM

## 2022-06-01 DIAGNOSIS — Z3A34 34 weeks gestation of pregnancy: Secondary | ICD-10-CM

## 2022-06-01 DIAGNOSIS — O26872 Cervical shortening, second trimester: Secondary | ICD-10-CM

## 2022-06-01 DIAGNOSIS — Z3480 Encounter for supervision of other normal pregnancy, unspecified trimester: Secondary | ICD-10-CM

## 2022-06-01 NOTE — Progress Notes (Signed)
   PRENATAL VISIT NOTE  Subjective:  Haley Leblanc is a 22 y.o. G1P0 at [redacted]w[redacted]d being seen today for ongoing prenatal care.  She is currently monitored for the following issues for this low-risk pregnancy and has Supervision of other normal pregnancy, antepartum; Carrier of spinal muscular atrophy; Short cervix during pregnancy in second trimester; and Echogenic intracardiac focus of fetus on prenatal ultrasound on their problem list.  Patient reports no complaints.  Contractions: Not present. Vag. Bleeding: None.  Movement: Present. Denies leaking of fluid.   The following portions of the patient's history were reviewed and updated as appropriate: allergies, current medications, past family history, past medical history, past social history, past surgical history and problem list.   Objective:   Vitals:   06/01/22 1425  BP: 119/80  Pulse: 90  Weight: 161 lb 9.6 oz (73.3 kg)    Fetal Status: Fetal Heart Rate (bpm): 146 Fundal Height: 32 cm Movement: Present     General:  Alert, oriented and cooperative. Patient is in no acute distress.  Skin: Skin is warm and dry. No rash noted.   Cardiovascular: Normal heart rate noted  Respiratory: Normal respiratory effort, no problems with respiration noted  Abdomen: Soft, gravid, appropriate for gestational age.  Pain/Pressure: Absent     Pelvic: Cervical exam deferred        Extremities: Normal range of motion.  Edema: None  Mental Status: Normal mood and affect. Normal behavior. Normal judgment and thought content.   Assessment and Plan:  Pregnancy: G1P0 at [redacted]w[redacted]d 1. Supervision of other normal pregnancy, antepartum FH appropriate Vigorous movement Having some nausea/vomiting with certain foods or large meals. Recommended portion control and possibly TUMS - No longer interested in WB any more  2. Short cervix during pregnancy in second trimester   Preterm labor symptoms and general obstetric precautions including but not limited to  vaginal bleeding, contractions, leaking of fluid and fetal movement were reviewed in detail with the patient. Please refer to After Visit Summary for other counseling recommendations.   No follow-ups on file.  Future Appointments  Date Time Provider Department Center  06/15/2022  1:55 PM Federico Flake, MD [facility]  06/27/2022  2:35 PM Federico Flake, MD [facility]  07/04/2022  2:35 PM Federico Flake, MD [facility]  07/11/2022  1:15 PM [facility]  07/11/2022  2:35 PM Federico Flake, MD [facility]    Federico Flake, MD

## 2022-06-15 ENCOUNTER — Other Ambulatory Visit: Payer: Self-pay

## 2022-06-15 ENCOUNTER — Ambulatory Visit (INDEPENDENT_AMBULATORY_CARE_PROVIDER_SITE_OTHER): Payer: Medicaid Other | Admitting: Family Medicine

## 2022-06-15 ENCOUNTER — Other Ambulatory Visit (HOSPITAL_COMMUNITY)
Admission: RE | Admit: 2022-06-15 | Discharge: 2022-06-15 | Disposition: A | Discharge status: Home / Self Care | Payer: Medicaid Other | Source: Ambulatory Visit | Attending: Family Medicine | Admitting: Family Medicine

## 2022-06-15 ENCOUNTER — Encounter: Payer: Self-pay | Admitting: Family Medicine

## 2022-06-15 VITALS — BP 117/78 | HR 80 | Wt 163.0 lb

## 2022-06-15 DIAGNOSIS — O26873 Cervical shortening, third trimester: Secondary | ICD-10-CM

## 2022-06-15 DIAGNOSIS — Z3A36 36 weeks gestation of pregnancy: Secondary | ICD-10-CM

## 2022-06-15 DIAGNOSIS — Z148 Genetic carrier of other disease: Secondary | ICD-10-CM

## 2022-06-15 DIAGNOSIS — Z1332 Encounter for screening for maternal depression: Secondary | ICD-10-CM | POA: Diagnosis not present

## 2022-06-15 DIAGNOSIS — O26872 Cervical shortening, second trimester: Secondary | ICD-10-CM

## 2022-06-15 DIAGNOSIS — Z3480 Encounter for supervision of other normal pregnancy, unspecified trimester: Secondary | ICD-10-CM | POA: Diagnosis present

## 2022-06-15 NOTE — Progress Notes (Signed)
   PRENATAL VISIT NOTE  Subjective:  Haley Leblanc is a 22 y.o. G1P0 at [redacted]w[redacted]d being seen today for ongoing prenatal care.  She is currently monitored for the following issues for this low-risk pregnancy and has Supervision of other normal pregnancy, antepartum; Carrier of spinal muscular atrophy; Short cervix during pregnancy in second trimester; and Echogenic intracardiac focus of fetus on prenatal ultrasound on their problem list.  Patient reports no complaints.  Contractions: Not present. Vag. Bleeding: None.  Movement: Present. Denies leaking of fluid.   The following portions of the patient's history were reviewed and updated as appropriate: allergies, current medications, past family history, past medical history, past social history, past surgical history and problem list.   Objective:   Vitals:   06/15/22 1419  BP: 117/78  Pulse: 80  Weight: 163 lb (73.9 kg)    Fetal Status: Fetal Heart Rate (bpm): 145 Fundal Height: 35 cm Movement: Present  Presentation: Vertex  General:  Alert, oriented and cooperative. Patient is in no acute distress.  Skin: Skin is warm and dry. No rash noted.   Cardiovascular: Normal heart rate noted  Respiratory: Normal respiratory effort, no problems with respiration noted  Abdomen: Soft, gravid, appropriate for gestational age.  Pain/Pressure: Absent     Pelvic: Cervical exam deferred        Extremities: Normal range of motion.     Mental Status: Normal mood and affect. Normal behavior. Normal judgment and thought content.   Assessment and Plan:  Pregnancy: G1P0 at [redacted]w[redacted]d 1. Supervision of other normal pregnancy, antepartum Up to date  FH appropriate  Having "lightening" crotch but no other concerns 36 wk swabs scheduled.   2. Carrier of spinal muscular atrophy Partner testing negative  3. Short cervix during pregnancy in second trimester Last was in march, 2.8cm but labor s/sx  Preterm labor symptoms and general obstetric precautions  including but not limited to vaginal bleeding, contractions, leaking of fluid and fetal movement were reviewed in detail with the patient. Please refer to After Visit Summary for other counseling recommendations.   Return in about 1 week (around 06/22/2022) for Routine prenatal care, Mom+Baby Combined Care.  Future Appointments  Date Time Provider Department Center  06/27/2022  2:35 PM Federico Flake, MD [facility]  07/04/2022  2:35 PM Federico Flake, MD [facility]  07/11/2022  1:15 PM [facility]  07/11/2022  2:35 PM Federico Flake, MD [facility]    Federico Flake, MD

## 2022-06-16 LAB — GC/CHLAMYDIA PROBE AMP (~~LOC~~) NOT AT ARMC
Chlamydia: NEGATIVE
Comment: NEGATIVE
Comment: NORMAL
Neisseria Gonorrhea: NEGATIVE

## 2022-06-19 LAB — CULTURE, BETA STREP (GROUP B ONLY): Strep Gp B Culture: NEGATIVE

## 2022-06-27 ENCOUNTER — Ambulatory Visit (INDEPENDENT_AMBULATORY_CARE_PROVIDER_SITE_OTHER): Payer: Medicaid Other | Admitting: Family Medicine

## 2022-06-27 ENCOUNTER — Other Ambulatory Visit: Payer: Self-pay

## 2022-06-27 VITALS — BP 129/71 | HR 83 | Wt 160.9 lb

## 2022-06-27 DIAGNOSIS — Z3480 Encounter for supervision of other normal pregnancy, unspecified trimester: Secondary | ICD-10-CM

## 2022-06-27 DIAGNOSIS — Z3A38 38 weeks gestation of pregnancy: Secondary | ICD-10-CM

## 2022-06-27 DIAGNOSIS — O26872 Cervical shortening, second trimester: Secondary | ICD-10-CM

## 2022-06-27 DIAGNOSIS — O283 Abnormal ultrasonic finding on antenatal screening of mother: Secondary | ICD-10-CM

## 2022-06-27 DIAGNOSIS — Z148 Genetic carrier of other disease: Secondary | ICD-10-CM

## 2022-06-27 DIAGNOSIS — O26873 Cervical shortening, third trimester: Secondary | ICD-10-CM

## 2022-06-27 NOTE — Progress Notes (Unsigned)
   PRENATAL VISIT NOTE  Subjective:  Haley Leblanc is a 22 y.o. G1P0 at [redacted]w[redacted]d being seen today for ongoing prenatal care.  She is currently monitored for the following issues for this low-risk pregnancy and has Supervision of other normal pregnancy, antepartum; Carrier of spinal muscular atrophy; Short cervix during pregnancy in second trimester; and Echogenic intracardiac focus of fetus on prenatal ultrasound on their problem list.  Patient reports no complaints.  Contractions: Not present. Vag. Bleeding: None.  Movement: Present. Denies leaking of fluid.   The following portions of the patient's history were reviewed and updated as appropriate: allergies, current medications, past family history, past medical history, past social history, past surgical history and problem list.   Objective:   Vitals:   06/27/22 1456  BP: 129/71  Pulse: 83  Weight: 160 lb 14.4 oz (73 kg)    Fetal Status: Fetal Heart Rate (bpm): 150   Movement: Present     General:  Alert, oriented and cooperative. Patient is in no acute distress.  Skin: Skin is warm and dry. No rash noted.   Cardiovascular: Normal heart rate noted  Respiratory: Normal respiratory effort, no problems with respiration noted  Abdomen: Soft, gravid, appropriate for gestational age.  Pain/Pressure: Present     Pelvic: Cervical exam deferred        Extremities: Normal range of motion.  Edema: None  Mental Status: Normal mood and affect. Normal behavior. Normal judgment and thought content.   Assessment and Plan:  Pregnancy: G1P0 at [redacted]w[redacted]d 1. Carrier of spinal muscular atrophy  2. Echogenic intracardiac focus of fetus on prenatal ultrasound  3. Short cervix during pregnancy in second trimester Now term  4. Supervision of other normal pregnancy, antepartum Up to date FH appropriate  {Blank single:19197::"Term","Preterm"} labor symptoms and general obstetric precautions including but not limited to vaginal bleeding, contractions,  leaking of fluid and fetal movement were reviewed in detail with the patient. Please refer to After Visit Summary for other counseling recommendations.   No follow-ups on file.  Future Appointments  Date Time Provider Department Center  07/04/2022  2:35 PM Federico Flake, MD [facility]  07/11/2022  1:15 PM [facility]  07/11/2022  2:35 PM Federico Flake, MD [facility]    Federico Flake, MD

## 2022-06-30 ENCOUNTER — Encounter: Payer: Self-pay | Admitting: Family Medicine

## 2022-07-02 ENCOUNTER — Encounter: Payer: Self-pay | Admitting: Obstetrics and Gynecology

## 2022-07-04 ENCOUNTER — Ambulatory Visit (INDEPENDENT_AMBULATORY_CARE_PROVIDER_SITE_OTHER): Payer: Medicaid Other | Admitting: Family Medicine

## 2022-07-04 ENCOUNTER — Other Ambulatory Visit: Payer: Self-pay

## 2022-07-04 ENCOUNTER — Encounter: Payer: Self-pay | Admitting: Family Medicine

## 2022-07-04 VITALS — BP 121/81 | HR 94 | Wt 167.6 lb

## 2022-07-04 DIAGNOSIS — O26872 Cervical shortening, second trimester: Secondary | ICD-10-CM

## 2022-07-04 DIAGNOSIS — O26873 Cervical shortening, third trimester: Secondary | ICD-10-CM

## 2022-07-04 DIAGNOSIS — Z3A39 39 weeks gestation of pregnancy: Secondary | ICD-10-CM

## 2022-07-04 DIAGNOSIS — Z3480 Encounter for supervision of other normal pregnancy, unspecified trimester: Secondary | ICD-10-CM

## 2022-07-04 NOTE — Progress Notes (Signed)
   PRENATAL VISIT NOTE  Subjective:  Haley Leblanc is a 22 y.o. G1P0 at [redacted]w[redacted]d being seen today for ongoing prenatal care.  She is currently monitored for the following issues for this low-risk pregnancy and has Supervision of other normal pregnancy, antepartum; Carrier of spinal muscular atrophy; Short cervix during pregnancy in second trimester; and Echogenic intracardiac focus of fetus on prenatal ultrasound on their problem list.  Patient reports no complaints.  Contractions: Not present. Vag. Bleeding: None.  Movement: Present. Denies leaking of fluid.   The following portions of the patient's history were reviewed and updated as appropriate: allergies, current medications, past family history, past medical history, past social history, past surgical history and problem list.   Objective:   Vitals:   07/04/22 1519 07/04/22 1523  BP: (!) 147/85 121/81  Pulse: 65 94  Weight: 167 lb 9.6 oz (76 kg)     Fetal Status: Fetal Heart Rate (bpm): 140   Movement: Present     General:  Alert, oriented and cooperative. Patient is in no acute distress.  Skin: Skin is warm and dry. No rash noted.   Cardiovascular: Normal heart rate noted  Respiratory: Normal respiratory effort, no problems with respiration noted  Abdomen: Soft, gravid, appropriate for gestational age.  Pain/Pressure: Absent     Pelvic: Cervical exam deferred        Extremities: Normal range of motion.     Mental Status: Normal mood and affect. Normal behavior. Normal judgment and thought content.   Assessment and Plan:  Pregnancy: G1P0 at [redacted]w[redacted]d  1. Supervision of other normal pregnancy, antepartum Up to date Prefers IOL after 41wk. OK with [redacted]w[redacted]d. Discussed IOl process briefly Scheduled for 6/27 midnight NST/BPP at 41 wks exactly with routine prenatal visit Reviewed labor precautions Stops working 6/16  2. Short cervix during pregnancy in second trimester Not an issue now  Term labor symptoms and general obstetric  precautions including but not limited to vaginal bleeding, contractions, leaking of fluid and fetal movement were reviewed in detail with the patient. Please refer to After Visit Summary for other counseling recommendations.   Return in about 1 week (around 07/11/2022) for Routine prenatal care, Mom+Baby Combined Care.  Future Appointments  Date Time Provider Department Center  07/11/2022  1:15 PM [facility]2 [facility]  07/11/2022  2:35 PM Federico Flake, MD [facility]  07/18/2022  1:15 PM [facility] US2 [facility]  07/18/2022  1:35 PM Federico Flake, MD [facility]  07/21/2022 12:00 AM [facility] SCHED ROOM [facility] None    Federico Flake, MD

## 2022-07-06 ENCOUNTER — Encounter: Payer: Self-pay | Admitting: *Deleted

## 2022-07-09 ENCOUNTER — Other Ambulatory Visit: Payer: Self-pay

## 2022-07-09 ENCOUNTER — Inpatient Hospital Stay (HOSPITAL_COMMUNITY): Payer: Medicaid Other | Admitting: Anesthesiology

## 2022-07-09 ENCOUNTER — Encounter (HOSPITAL_COMMUNITY): Payer: Self-pay

## 2022-07-09 ENCOUNTER — Inpatient Hospital Stay (HOSPITAL_COMMUNITY)
Admission: EM | Admit: 2022-07-09 | Discharge: 2022-07-11 | DRG: 807 | Disposition: A | Discharge status: Home / Self Care | Payer: Medicaid Other | Attending: Obstetrics & Gynecology | Admitting: Obstetrics & Gynecology

## 2022-07-09 DIAGNOSIS — Z3480 Encounter for supervision of other normal pregnancy, unspecified trimester: Secondary | ICD-10-CM

## 2022-07-09 DIAGNOSIS — O26872 Cervical shortening, second trimester: Secondary | ICD-10-CM | POA: Diagnosis present

## 2022-07-09 DIAGNOSIS — O479 False labor, unspecified: Principal | ICD-10-CM

## 2022-07-09 DIAGNOSIS — O283 Abnormal ultrasonic finding on antenatal screening of mother: Secondary | ICD-10-CM | POA: Diagnosis present

## 2022-07-09 DIAGNOSIS — O134 Gestational [pregnancy-induced] hypertension without significant proteinuria, complicating childbirth: Principal | ICD-10-CM | POA: Diagnosis present

## 2022-07-09 DIAGNOSIS — Z148 Genetic carrier of other disease: Secondary | ICD-10-CM | POA: Diagnosis not present

## 2022-07-09 DIAGNOSIS — Z30017 Encounter for initial prescription of implantable subdermal contraceptive: Secondary | ICD-10-CM | POA: Diagnosis not present

## 2022-07-09 DIAGNOSIS — O26893 Other specified pregnancy related conditions, third trimester: Secondary | ICD-10-CM | POA: Diagnosis present

## 2022-07-09 DIAGNOSIS — Z3A39 39 weeks gestation of pregnancy: Secondary | ICD-10-CM

## 2022-07-09 DIAGNOSIS — O4202 Full-term premature rupture of membranes, onset of labor within 24 hours of rupture: Secondary | ICD-10-CM | POA: Diagnosis not present

## 2022-07-09 LAB — CBC WITH DIFFERENTIAL/PLATELET
Abs Immature Granulocytes: 0.03 10*3/uL (ref 0.00–0.07)
Basophils Absolute: 0 10*3/uL (ref 0.0–0.1)
Basophils Relative: 0 %
Eosinophils Absolute: 0 10*3/uL (ref 0.0–0.5)
Eosinophils Relative: 1 %
HCT: 34.8 % — ABNORMAL LOW (ref 36.0–46.0)
Hemoglobin: 11.8 g/dL — ABNORMAL LOW (ref 12.0–15.0)
Immature Granulocytes: 1 %
Lymphocytes Relative: 23 %
Lymphs Abs: 1.5 10*3/uL (ref 0.7–4.0)
MCH: 30.6 pg (ref 26.0–34.0)
MCHC: 33.9 g/dL (ref 30.0–36.0)
MCV: 90.4 fL (ref 80.0–100.0)
Monocytes Absolute: 0.5 10*3/uL (ref 0.1–1.0)
Monocytes Relative: 8 %
Neutro Abs: 4.5 10*3/uL (ref 1.7–7.7)
Neutrophils Relative %: 67 %
Platelets: 130 10*3/uL — ABNORMAL LOW (ref 150–400)
RBC: 3.85 MIL/uL — ABNORMAL LOW (ref 3.87–5.11)
RDW: 13.1 % (ref 11.5–15.5)
WBC: 6.6 10*3/uL (ref 4.0–10.5)
nRBC: 0 % (ref 0.0–0.2)

## 2022-07-09 LAB — CBC
HCT: 33.9 % — ABNORMAL LOW (ref 36.0–46.0)
HCT: 35 % — ABNORMAL LOW (ref 36.0–46.0)
Hemoglobin: 11.9 g/dL — ABNORMAL LOW (ref 12.0–15.0)
Hemoglobin: 12 g/dL (ref 12.0–15.0)
MCH: 31.6 pg (ref 26.0–34.0)
MCH: 30.3 pg (ref 26.0–34.0)
MCHC: 35.1 g/dL (ref 30.0–36.0)
MCHC: 34.3 g/dL (ref 30.0–36.0)
MCV: 90.2 fL (ref 80.0–100.0)
MCV: 88.4 fL (ref 80.0–100.0)
Platelets: 135 10*3/uL — ABNORMAL LOW (ref 150–400)
Platelets: 135 10*3/uL — ABNORMAL LOW (ref 150–400)
RBC: 3.76 MIL/uL — ABNORMAL LOW (ref 3.87–5.11)
RBC: 3.96 MIL/uL (ref 3.87–5.11)
RDW: 13.1 % (ref 11.5–15.5)
RDW: 13 % (ref 11.5–15.5)
WBC: 6.8 10*3/uL (ref 4.0–10.5)
WBC: 11.1 10*3/uL — ABNORMAL HIGH (ref 4.0–10.5)
nRBC: 0 % (ref 0.0–0.2)
nRBC: 0 % (ref 0.0–0.2)

## 2022-07-09 LAB — TYPE AND SCREEN
ABO/RH(D): O POS
ABO/RH(D): O POS
Antibody Screen: NEGATIVE
Antibody Screen: NEGATIVE

## 2022-07-09 LAB — COMPREHENSIVE METABOLIC PANEL
ALT: 32 U/L (ref 0–44)
AST: 25 U/L (ref 15–41)
Albumin: 2.8 g/dL — ABNORMAL LOW (ref 3.5–5.0)
Alkaline Phosphatase: 135 U/L — ABNORMAL HIGH (ref 38–126)
Anion gap: 7 (ref 5–15)
BUN: 13 mg/dL (ref 6–20)
CO2: 22 mmol/L (ref 22–32)
Calcium: 9 mg/dL (ref 8.9–10.3)
Chloride: 105 mmol/L (ref 98–111)
Creatinine, Ser: 0.82 mg/dL (ref 0.44–1.00)
GFR, Estimated: >60 mL/min (ref >60)
Glucose, Bld: 96 mg/dL (ref 70–99)
Potassium: 3.8 mmol/L (ref 3.5–5.1)
Sodium: 134 mmol/L — ABNORMAL LOW (ref 135–145)
Total Bilirubin: 0.7 mg/dL (ref 0.3–1.2)
Total Protein: 6.8 g/dL (ref 6.5–8.1)

## 2022-07-09 LAB — ABO/RH: ABO/RH(D): O POS

## 2022-07-09 LAB — HIV ANTIBODY (ROUTINE TESTING W REFLEX): HIV Screen 4th Generation wRfx: NONREACTIVE

## 2022-07-09 LAB — RPR: RPR Ser Ql: NONREACTIVE

## 2022-07-09 MED ORDER — SENNOSIDES-DOCUSATE SODIUM 8.6-50 MG PO TABS
2.0000 | ORAL_TABLET | Freq: Every day | ORAL | Status: DC
  Administered 2022-07-10 – 2022-07-11 (×2): 2 via ORAL
  Filled 2022-07-09 (×2): qty 2

## 2022-07-09 MED ORDER — TETANUS-DIPHTH-ACELL PERTUSSIS 5-2.5-18.5 LF-MCG/0.5 IM SUSY: 0.5000 mL | PREFILLED_SYRINGE | Freq: Once | INTRAMUSCULAR | Status: DC

## 2022-07-09 MED ORDER — OXYTOCIN BOLUS FROM INFUSION
333.0000 mL | Freq: Once | INTRAVENOUS | Status: AC
  Administered 2022-07-09: 333 mL via INTRAVENOUS

## 2022-07-09 MED ORDER — LABETALOL HCL 5 MG/ML IV SOLN: 40.0000 mg | INTRAVENOUS | Status: DC | PRN

## 2022-07-09 MED ORDER — LIDOCAINE-EPINEPHRINE (PF) 2 %-1:200000 IJ SOLN
INTRAMUSCULAR | Status: DC | PRN
  Administered 2022-07-09: 5 mL via EPIDURAL

## 2022-07-09 MED ORDER — OXYCODONE-ACETAMINOPHEN 5-325 MG PO TABS: 1.0000 | ORAL_TABLET | ORAL | Status: DC | PRN

## 2022-07-09 MED ORDER — LACTATED RINGERS IV SOLN: INTRAVENOUS | Status: DC

## 2022-07-09 MED ORDER — PHENYLEPHRINE 80 MCG/ML (10ML) SYRINGE FOR IV PUSH (FOR BLOOD PRESSURE SUPPORT): 80.0000 ug | PREFILLED_SYRINGE | INTRAVENOUS | Status: DC | PRN

## 2022-07-09 MED ORDER — ONDANSETRON HCL 4 MG/2ML IJ SOLN
4.0000 mg | Freq: Four times a day (QID) | INTRAMUSCULAR | Status: DC | PRN
  Administered 2022-07-09: 4 mg via INTRAVENOUS
  Filled 2022-07-09: qty 2

## 2022-07-09 MED ORDER — FENTANYL-BUPIVACAINE-NACL 0.5-0.125-0.9 MG/250ML-% EP SOLN
EPIDURAL | Status: AC
  Filled 2022-07-09: qty 250

## 2022-07-09 MED ORDER — LABETALOL HCL 5 MG/ML IV SOLN: 80.0000 mg | INTRAVENOUS | Status: DC | PRN

## 2022-07-09 MED ORDER — DIPHENHYDRAMINE HCL 25 MG PO CAPS: 25.0000 mg | ORAL_CAPSULE | Freq: Four times a day (QID) | ORAL | Status: DC | PRN

## 2022-07-09 MED ORDER — FUROSEMIDE 20 MG PO TABS
20.0000 mg | ORAL_TABLET | Freq: Every day | ORAL | Status: DC
  Administered 2022-07-09 – 2022-07-11 (×3): 20 mg via ORAL
  Filled 2022-07-09 (×3): qty 1

## 2022-07-09 MED ORDER — LACTATED RINGERS IV SOLN: 500.0000 mL | Freq: Once | INTRAVENOUS | Status: DC

## 2022-07-09 MED ORDER — LABETALOL HCL 5 MG/ML IV SOLN: 20.0000 mg | INTRAVENOUS | Status: DC | PRN

## 2022-07-09 MED ORDER — BENZOCAINE-MENTHOL 20-0.5 % EX AERO: 1.0000 | INHALATION_SPRAY | CUTANEOUS | Status: DC | PRN

## 2022-07-09 MED ORDER — EPHEDRINE 5 MG/ML INJ: 10.0000 mg | INTRAVENOUS | Status: DC | PRN

## 2022-07-09 MED ORDER — ACETAMINOPHEN 325 MG PO TABS: 650.0000 mg | ORAL_TABLET | ORAL | Status: DC | PRN

## 2022-07-09 MED ORDER — OXYTOCIN-SODIUM CHLORIDE 30-0.9 UT/500ML-% IV SOLN
1.0000 m[IU]/min | INTRAVENOUS | Status: DC
  Administered 2022-07-09: 2 m[IU]/min via INTRAVENOUS
  Filled 2022-07-09: qty 500

## 2022-07-09 MED ORDER — SIMETHICONE 80 MG PO CHEW: 80.0000 mg | CHEWABLE_TABLET | ORAL | Status: DC | PRN

## 2022-07-09 MED ORDER — TERBUTALINE SULFATE 1 MG/ML IJ SOLN: 0.2500 mg | Freq: Once | INTRAMUSCULAR | Status: DC | PRN

## 2022-07-09 MED ORDER — DIBUCAINE (PERIANAL) 1 % EX OINT: 1.0000 | TOPICAL_OINTMENT | CUTANEOUS | Status: DC | PRN

## 2022-07-09 MED ORDER — WITCH HAZEL-GLYCERIN EX PADS: 1.0000 | MEDICATED_PAD | CUTANEOUS | Status: DC | PRN

## 2022-07-09 MED ORDER — COCONUT OIL OIL: 1.0000 | TOPICAL_OIL | Status: DC | PRN

## 2022-07-09 MED ORDER — PRENATAL MULTIVITAMIN CH
1.0000 | ORAL_TABLET | Freq: Every day | ORAL | Status: DC
  Administered 2022-07-10 – 2022-07-11 (×2): 1 via ORAL
  Filled 2022-07-09 (×2): qty 1

## 2022-07-09 MED ORDER — FENTANYL-BUPIVACAINE-NACL 0.5-0.125-0.9 MG/250ML-% EP SOLN
12.0000 mL/h | EPIDURAL | Status: DC | PRN
  Administered 2022-07-09: 12 mL/h via EPIDURAL

## 2022-07-09 MED ORDER — ONDANSETRON HCL 4 MG/2ML IJ SOLN: 4.0000 mg | INTRAMUSCULAR | Status: DC | PRN

## 2022-07-09 MED ORDER — IBUPROFEN 600 MG PO TABS
600.0000 mg | ORAL_TABLET | Freq: Four times a day (QID) | ORAL | Status: DC
  Administered 2022-07-09 – 2022-07-11 (×8): 600 mg via ORAL
  Filled 2022-07-09 (×8): qty 1

## 2022-07-09 MED ORDER — ONDANSETRON HCL 4 MG PO TABS: 4.0000 mg | ORAL_TABLET | ORAL | Status: DC | PRN

## 2022-07-09 MED ORDER — DIPHENHYDRAMINE HCL 50 MG/ML IJ SOLN: 12.5000 mg | INTRAMUSCULAR | Status: DC | PRN

## 2022-07-09 MED ORDER — LIDOCAINE HCL (PF) 1 % IJ SOLN: 30.0000 mL | INTRAMUSCULAR | Status: DC | PRN

## 2022-07-09 MED ORDER — HYDRALAZINE HCL 20 MG/ML IJ SOLN: 10.0000 mg | INTRAMUSCULAR | Status: DC | PRN

## 2022-07-09 MED ORDER — LACTATED RINGERS IV SOLN: 500.0000 mL | INTRAVENOUS | Status: DC | PRN

## 2022-07-09 MED ORDER — SOD CITRATE-CITRIC ACID 500-334 MG/5ML PO SOLN: 30.0000 mL | ORAL | Status: DC | PRN

## 2022-07-09 MED ORDER — ZOLPIDEM TARTRATE 5 MG PO TABS: 5.0000 mg | ORAL_TABLET | Freq: Every evening | ORAL | Status: DC | PRN

## 2022-07-09 MED ORDER — OXYTOCIN-SODIUM CHLORIDE 30-0.9 UT/500ML-% IV SOLN: 2.5000 [IU]/h | INTRAVENOUS | Status: DC

## 2022-07-09 MED ORDER — OXYCODONE-ACETAMINOPHEN 5-325 MG PO TABS: 2.0000 | ORAL_TABLET | ORAL | Status: DC | PRN

## 2022-07-09 NOTE — Progress Notes (Signed)
0522[name] called for pt "due in two days" with "labor pains"  0535: OBRRN at bedside, pt breathing heavily through ctx.   0543: Pt placed on monitor.  5409: SVE 6/90/0.  0549: Dr. Despina Hidden called and notified of pt G1P0, 39.5 GA. Came to [facility] with c/o ctx since 2:41AM. Does not recall SROM. Endorses fetal movement, has had some bloody show, denies HA and blurry vision. SVE 6/90/0. Orders to transfer to [facility] LD as soon as possible.  8119[name], LD Charge RN called and notified of pt G1P0, 39.5 GA. Came to [facility] with c/o ctx since 2:41AM. Does not recall SROM. Endorses fetal movement, has had some bloody show, denies HA and blurry vision. SVE 6/90/0, room 204 assigned.   1478[name] notified that [facility] do not have available truck to dispatch.  2956: Dr. Despina Hidden called and notified that [facility] and [facility] do not have trucks available, states if pt is comfortable may transfer POV.   0620: Pt agrees to transfer POV. Transferred from [facility] at this time.   2130: Pt placed on monitor at [facility] L&D.

## 2022-07-09 NOTE — Anesthesia Procedure Notes (Signed)
Epidural Patient location during procedure: OB Start time: 07/09/2022 6:45 AM End time: 07/09/2022 6:55 AM  Staffing Anesthesiologist: Elmer Picker, MD Performed: anesthesiologist   Preanesthetic Checklist Completed: patient identified, IV checked, risks and benefits discussed, monitors and equipment checked, pre-op evaluation and timeout performed  Epidural Patient position: sitting Prep: DuraPrep and site prepped and draped Patient monitoring: continuous pulse ox, blood pressure, heart rate and cardiac monitor Approach: midline Location: L3-L4 Injection technique: LOR air  Needle:  Needle type: Tuohy  Needle gauge: 17 G Needle length: 9 cm Needle insertion depth: 5 cm Catheter type: closed end flexible Catheter size: 19 Gauge Catheter at skin depth: 10 cm Test dose: negative  Assessment Sensory level: T8 Events: blood not aspirated, no cerebrospinal fluid, injection not painful, no injection resistance, no paresthesia and negative IV test  Additional Notes Patient identified. Risks/Benefits/Options discussed with patient including but not limited to bleeding, infection, nerve damage, paralysis, failed block, incomplete pain control, headache, blood pressure changes, nausea, vomiting, reactions to medication both or allergic, itching and postpartum back pain. Confirmed with bedside nurse the patient's most recent platelet count. Confirmed with patient that they are not currently taking any anticoagulation, have any bleeding history or any family history of bleeding disorders. Patient expressed understanding and wished to proceed. All questions were answered. Sterile technique was used throughout the entire procedure. Please see nursing notes for vital signs. Test dose was given through epidural catheter and negative prior to continuing to dose epidural or start infusion. Warning signs of high block given to the patient including shortness of breath, tingling/numbness in hands,  complete motor block, or any concerning symptoms with instructions to call for help. Patient was given instructions on fall risk and not to get out of bed. All questions and concerns addressed with instructions to call with any issues or inadequate analgesia.  Reason for block:procedure for pain

## 2022-07-09 NOTE — ED Notes (Signed)
Pt arrived to ED with c/o labor pains for the last 4 hours. Pt is G1P0 and [redacted] weeks pregnant. [facility] nurse Courtney at bedside. FHR 145 at baseline. Per OB nurse pt is 6 cm dilated and contractions are 2-3 minutes apart. Pt transferred to [facility] by POV with rapid OB nurse Courtney at [phone]. Pt IV line remains in place on transfer.

## 2022-07-09 NOTE — ED Triage Notes (Signed)
Patient reports she is almost [redacted] weeks pregnant and woke up with contractions around 2:41 this AM. Water has not broke. States contractions are close together but does not know exact time in between. Misty Stanley PA in room to see patient. [facility] called and notified as well.

## 2022-07-09 NOTE — Progress Notes (Signed)
Labor Progress Note Haley Leblanc is a 22 y.o. G1P0 at [redacted]w[redacted]d presented for SOL.  S: No acute concerns.   O:  BP 134/87   Pulse 79   Temp 98.4 F (36.9 C) (Oral)   Resp 16   Ht 5\' 7"  (1.702 m)   Wt 75.8 kg   SpO2 99%   BMI 26.16 kg/m  EFM: 140bpm/moderate/+accels  CVE: Dilation: 8.5 Effacement (%): 90 Cervical Position: Middle Station: 0 Presentation: Vertex Exam by:: Autry-Lott  A&P: 22 y.o. G1P0 [redacted]w[redacted]d here for SOL.  #Labor: Start pitocin to decrease intervals between contractions prior to second stage.  #Pain: Epidural #FWB: Cat I  #GBS negative  gHTN v. PreE Asymptomatic. Pre E labs wnl. UPC not done due to ROM.  -Continue to monitor -Labetalol protocol -lasix in PP period  [employer], DO 11:42 AM

## 2022-07-09 NOTE — Progress Notes (Signed)
Labor Progress Note TAYLEN JOINER is a 22 y.o. G1P0 at [redacted]w[redacted]d presented for SOL.  S: No acute concerns.   O:  BP (!) 156/88   Pulse 84   Temp 99.1 F (37.3 C) (Oral)   Resp 16   Ht 5\' 7"  (1.702 m)   Wt 75.8 kg   SpO2 99%   BMI 26.16 kg/m  EFM: 135bpm/moderate/+accels, no decels  CVE: Dilation: 9 Effacement (%): 100 Cervical Position: Middle Station: 0, Plus 1 Presentation: Vertex Exam by:: Autry-Lott  A&P: 22 y.o. G1P0 [redacted]w[redacted]d here for SOL.  #Labor: Continue current management. Hopeful for vaginal delivery soon.  #Pain: Epidural #FWB: Cat I  #GBS negative  gHTN v. PreE Mild range. -Continue to monitor -Labetalol protocol -lasix in PP period  Cumi Sanagustin Autry-Lott, DO 3:07 PM

## 2022-07-09 NOTE — H&P (Signed)
OBSTETRIC ADMISSION HISTORY AND PHYSICAL  Haley Leblanc is a 22 y.o. female G1P0 with IUP at [redacted]w[redacted]d by 18 week presenting for spontaneous onset of labor. She presented to [facility] and was transferred here. She reports +FMs, No LOF, no VB, no blurry vision, headaches or peripheral edema, and RUQ pain.  She plans on breast feeding. She request nexplanon for birth control. 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       Nursing Staff Provider  Conservator, museum/gallery for Women Dating  18 week [place]  [facility] Model [ ]  Traditional [ ]  Centering [x]  Mom-Baby Dyad      Language  English Anatomy US  Normal anatomy   Flu Vaccine  declined Genetic/Carrier Screen  NIPS:   low risk AFP: normal Horizon: SMA carrier  TDaP Vaccine   04/18/22 Hgb A1C or  GTT Early - normal Third trimester 80/150/100  COVID Vaccine none   LAB RESULTS   Rhogam  N/a Blood Type O/Positive/-- (01/19 1032)   Baby Feeding Plan Breast  Antibody Negative (01/19 1032)  Contraception Nexplanon Rubella 6.53 (01/19 1032)  Circumcision N/a GIRL RPR Non Reactive (01/19 1032)   Pediatrician  MBCC HBsAg Negative (01/19 1032)   Support Person FOB HCVAb Non Reactive (01/19 1032)   Prenatal Classes   HIV Non Reactive (01/19 1032)     BTL Consent   GBS (For PCN allergy, check sensitivities)   VBAC Consent   Pap N/a - age           DME Rx [ ]  BP cuff [ ]  Weight Scale Waterbirth  [ ]  Class [ ]  Consent [ ]  CNM visit  PHQ9 & GAD7 [  ] new OB [ x ] 28 weeks  [ x] 36 weeks Induction  [ ]  Orders Entered [ ] Foley Y/N     Past Medical History: History reviewed. No pertinent past medical history.  Past Surgical History: Past Surgical History:  Procedure Laterality Date   right  wrist surgery      Obstetrical History: OB History     Gravida  1   Para      Term      Preterm      AB      Living         SAB      IAB      Ectopic      Multiple      Live Births              Social History Social History   Socioeconomic History   Marital status: Single    Spouse name: Not on file   Number of children: Not on file   Years of education: Not on file   Highest education level: GED or equivalent  Occupational History   Not on file  Tobacco Use   Smoking status: Never    Passive exposure: Yes   Smokeless tobacco: Not on file  Vaping Use   Vaping Use: Never used  Substance and Sexual Activity   Alcohol use: Not Currently   Drug use: Not Currently   Sexual activity: Yes  Other Topics Concern   Not on file  Social History Narrative  Not on file   Social Determinants of Health   Financial Resource Strain: Low Risk  (02/11/2022)   Overall Financial Resource Strain (CARDIA)    Difficulty of Paying Living Expenses: Not hard at all  Food Insecurity: No Food Insecurity (02/11/2022)   Hunger Vital Sign    Worried About Running Out of Food in the Last Year: Never true    Ran Out of Food in the Last Year: Never true  Transportation Needs: No Transportation Needs (02/11/2022)   PRAPARE - Administrator, Civil Service (Medical): No    Lack of Transportation (Non-Medical): No  Physical Activity: Unknown (02/11/2022)   Exercise Vital Sign    Days of Exercise per Week: 0 days    Minutes of Exercise per Session: Not on file  Stress: No Stress Concern Present (02/11/2022)   [employer] of Occupational Health - Occupational Stress Questionnaire    Feeling of Stress : Not at all  Social Connections: Socially Isolated (02/11/2022)   Social Connection and Isolation Panel [NHANES]    Frequency of Communication with Friends and Family: Three times a week    Frequency of Social Gatherings with Friends and Family: More than three  times a week    Attends Religious Services: Never    Database administrator or Organizations: No    Attends Engineer, structural: Not on file    Marital Status: Never married    Family History: Family History  Problem Relation Age of Onset   Asthma Neg Hx    Diabetes Neg Hx    Heart disease Neg Hx    Hypertension Neg Hx    Stroke Neg Hx     Allergies: No Known Allergies  Medications Prior to Admission  Medication Sig Dispense Refill Last Dose   Prenatal Vit-Fe Fumarate-FA (MULTIVITAMIN-PRENATAL) 27-0.8 MG TABS tablet Take 1 tablet by mouth daily.        Review of Systems   All systems reviewed and negative except as stated in HPI  Blood pressure (!) 141/86, pulse 79, temperature 98.3 F (36.8 C), temperature source Oral, resp. rate 18, height 5\' 7"  (1.702 m), weight 75.8 kg, SpO2 98 %.  General appearance: alert, cooperative, and appears stated age Lungs: clear to auscultation bilaterally Heart: regular rate and rhythm Abdomen: soft, non-tender; bowel sounds normal Pelvic: see below Extremities: Homans sign is negative, no sign of DVT  Presentation: cephalic  Fetal monitoring: 140 bpm, moderate variability, + accelerations, no decelerations  Uterine activity:  Dilation: 6 Effacement (%): 90 Station: 0 Exam by:: Casey Burkitt, RN   Prenatal labs: ABO, Rh: --/--/PENDING (06/15 9563) Antibody: PENDING (06/15 8756) Rubella: 6.53 (01/19 1032) RPR: Non Reactive (03/25 0904)  HBsAg: Negative (01/19 1032)  HIV: Non Reactive (03/25 0904)  GBS: Negative/-- (05/22 1630)  2 hr Glucola normal Genetic screening  low risk Anatomy US normal  Prenatal Transfer Tool  Maternal Diabetes: No Genetic Screening: Normal Maternal Ultrasounds/Referrals: Isolated EIF (echogenic intracardiac focus) Fetal Ultrasounds or other Referrals:  None Maternal Substance Abuse:  No Significant Maternal Medications:  None Significant Maternal Lab Results:  Group B Strep  negative Number of Prenatal Visits:Less than or equal to 3 verified prenatal visits Other Comments:  None  Results for orders placed or performed during the hospital encounter of 07/09/22 (from the past 24 hour(s))  CBC with Differential   Collection Time: 07/09/22  5:29 AM  Result Value Ref Range   WBC 6.6 4.0 - 10.5 K/uL   RBC  3.85 (L) 3.87 - 5.11 MIL/uL   Hemoglobin 11.8 (L) 12.0 - 15.0 g/dL   HCT 16.1 (L) 09.6 - 04.5 %   MCV 90.4 80.0 - 100.0 fL   MCH 30.6 26.0 - 34.0 pg   MCHC 33.9 30.0 - 36.0 g/dL   RDW 40.9 81.1 - 91.4 %   Platelets 130 (L) 150 - 400 K/uL   nRBC 0.0 0.0 - 0.2 %   Neutrophils Relative % 67 %   Neutro Abs 4.5 1.7 - 7.7 K/uL   Lymphocytes Relative 23 %   Lymphs Abs 1.5 0.7 - 4.0 K/uL   Monocytes Relative 8 %   Monocytes Absolute 0.5 0.1 - 1.0 K/uL   Eosinophils Relative 1 %   Eosinophils Absolute 0.0 0.0 - 0.5 K/uL   Basophils Relative 0 %   Basophils Absolute 0.0 0.0 - 0.1 K/uL   Immature Granulocytes 1 %   Abs Immature Granulocytes 0.03 0.00 - 0.07 K/uL  Comprehensive metabolic panel   Collection Time: 07/09/22  5:29 AM  Result Value Ref Range   Sodium 134 (L) 135 - 145 mmol/L   Potassium 3.8 3.5 - 5.1 mmol/L   Chloride 105 98 - 111 mmol/L   CO2 22 22 - 32 mmol/L   Glucose, Bld 96 70 - 99 mg/dL   BUN 13 6 - 20 mg/dL   Creatinine, Ser 7.82 0.44 - 1.00 mg/dL   Calcium 9.0 8.9 - 95.6 mg/dL   Total Protein 6.8 6.5 - 8.1 g/dL   Albumin 2.8 (L) 3.5 - 5.0 g/dL   AST 25 15 - 41 U/L   ALT 32 0 - 44 U/L   Alkaline Phosphatase 135 (H) 38 - 126 U/L   Total Bilirubin 0.7 0.3 - 1.2 mg/dL   GFR, Estimated >21 >30 mL/min   Anion gap 7 5 - 15  Type and screen   Collection Time: 07/09/22  5:29 AM  Result Value Ref Range   ABO/RH(D) PENDING    Antibody Screen PENDING    Sample Expiration      07/12/2022,2359 Performed at [facility], [address] [place], [place] [address]   CBC   Collection Time: 07/09/22  6:37 AM  Result Value  Ref Range   WBC 6.8 4.0 - 10.5 K/uL   RBC 3.76 (L) 3.87 - 5.11 MIL/uL   Hemoglobin 11.9 (L) 12.0 - 15.0 g/dL   HCT 46.9 (L) 62.9 - 52.8 %   MCV 90.2 80.0 - 100.0 fL   MCH 31.6 26.0 - 34.0 pg   MCHC 35.1 30.0 - 36.0 g/dL   RDW 41.3 24.4 - 01.0 %   Platelets 135 (L) 150 - 400 K/uL   nRBC 0.0 0.0 - 0.2 %  Type and screen [facility]   Collection Time: 07/09/22  6:37 AM  Result Value Ref Range   ABO/RH(D) PENDING    Antibody Screen PENDING    Sample Expiration      07/12/2022,2359 Performed at [facility], [address], [place], [place] [address]     Patient Active Problem List   Diagnosis Date Noted   Labor without complication 07/09/2022   Short cervix during pregnancy in second trimester 03/02/2022   Echogenic intracardiac focus of fetus on prenatal ultrasound 03/02/2022   Carrier of spinal muscular atrophy 02/23/2022   Supervision of other normal pregnancy, antepartum 01/25/2022    Assessment/Plan:  AVIELLA KOHLS is a 22 y.o. G1P0 at [redacted]w[redacted]d here for spontaneous onset  of labor. Noted to SROM during a cervical check, following her epidural  #Labor:Admit to L&D #Pain: Epidural in place #FWB: Cat 1 #ID:  GBS neg #MOF: breast #MOC: nexplanon #Circ:  na  Sheppard Evens MD MPH OB Fellow, Faculty Practice [facility], Center for [facility] 07/09/2022

## 2022-07-09 NOTE — Plan of Care (Signed)

## 2022-07-09 NOTE — ED Provider Notes (Addendum)
[place] EMERGENCY DEPARTMENT AT [facility] Provider Note   CSN: [id number] Arrival date & time: 07/09/22  4098     History  Chief Complaint  Patient presents with   Laboring    Haley Leblanc is a 22 y.o. female.  The history is provided by the patient and medical records.   22 year old G1P) [redacted]w[redacted]d gestation here with labor pains.  States she woke up at approximately 0241 this AM with contractions.  She denies any bleeding or loss of fluids.  States she does feel contractions fairly close together but unsure of exact intervals.  She is still feeling fetal movement. She has had normal prenatal care up until this point without any noted complications.  She is followed by OB/GYN, Dr. Crissie Reese and Dr. Marvis Moeller.  Home Medications Prior to Admission medications   Medication Sig Start Date End Date Taking? Authorizing Provider  Prenatal Vit-Fe Fumarate-FA (MULTIVITAMIN-PRENATAL) 27-0.8 MG TABS tablet Take 1 tablet by mouth daily. 11/16/21   [provider]      Allergies    Patient has no known allergies.    Review of Systems   Review of Systems  Gastrointestinal:        Laboring  All other systems reviewed and are negative.   Physical Exam Updated Vital Signs BP (!) 149/107   Pulse 76   Temp 98.3 F (36.8 C) (Oral)   Resp 20   Ht 5\' 7"  (1.702 m)   Wt 75.8 kg   SpO2 99%   BMI 26.16 kg/m   Physical Exam Vitals and nursing note reviewed.  Constitutional:      Appearance: She is well-developed.  HENT:     Head: Normocephalic and atraumatic.  Eyes:     Conjunctiva/sclera: Conjunctivae normal.     Pupils: Pupils are equal, round, and reactive to light.  Cardiovascular:     Rate and Rhythm: Normal rate and regular rhythm.     Heart sounds: Normal heart sounds.  Pulmonary:     Effort: Pulmonary effort is normal.     Breath sounds: Normal breath sounds.  Abdominal:     Comments: Gravid abdomen  Musculoskeletal:        General: Normal range of  motion.     Cervical back: Normal range of motion.  Skin:    General: Skin is warm and dry.  Neurological:     Mental Status: She is alert and oriented to person, place, and time.     ED Results / Procedures / Treatments   Labs (all labs ordered are listed, but only abnormal results are displayed) Labs Reviewed  CBC WITH DIFFERENTIAL/PLATELET - Abnormal; Notable for the following components:      Result Value   RBC 3.85 (*)    Hemoglobin 11.8 (*)    HCT 34.8 (*)    Platelets 130 (*)    All other components within normal limits  COMPREHENSIVE METABOLIC PANEL - Abnormal; Notable for the following components:   Sodium 134 (*)    Albumin 2.8 (*)    Alkaline Phosphatase 135 (*)    All other components within normal limits  TYPE AND SCREEN  ABO/RH    EKG None  Radiology No results found.  Procedures Procedures    CRITICAL CARE Performed by: Garlon Hatchet   Total critical care time: 45 minutes  Critical care time was exclusive of separately billable procedures and treating other patients.  Critical care was necessary to treat or prevent imminent or life-threatening deterioration.  Critical care was time spent personally by me on the following activities: development of treatment plan with patient and/or surrogate as well as nursing, discussions with consultants, evaluation of patient's response to treatment, examination of patient, obtaining history from patient or surrogate, ordering and performing treatments and interventions, ordering and review of laboratory studies, ordering and review of radiographic studies, pulse oximetry and re-evaluation of patient's condition.   Medications Ordered in ED Medications - No data to display  ED Course/ Medical Decision Making/ A&P                             Medical Decision Making Amount and/or Complexity of Data Reviewed Labs: ordered. ECG/medicine tests: ordered and independent interpretation performed.   22 y.o.  G1P0 [redacted]w[redacted]d here with labor, began this AM ar 0241.  Denies water breaking.  No bleeding.  Has had appropriate prenatal care up until this point.  She is still feeling fetal movement currently.  She appears gravid but in NAD.  [facility] called.    5:56 AM [facility] at bedside-- dilated to 6cm already.  Has spoken with on call OB, Dr. Despina Hidden-- will transport to [facility] ASAP.  [facility] does not have available truck, attempted to get [facility], [facility] will ride with as precaution.  6:12 AM [facility] still without available truck, [facility] still holding on 5 other emergency calls currently so cannot dispatch truck either.  Discussed with Dr. Despina Hidden-- she will go POV with [facility] RN, [facility] to follow.  Patient understands reasoning for such and is agreeable.    Final Clinical Impression(s) / ED Diagnoses Final diagnoses:  Uterine contractions  Normal labor    Rx / DC Orders ED Discharge Orders     None         Garlon Hatchet, PA-C 07/09/22 0630    Garlon Hatchet, PA-C 07/09/22 0631    Mardene Sayer, MD 07/09/22 [phone]

## 2022-07-09 NOTE — Progress Notes (Signed)
Labor Progress Note Haley Leblanc is a 22 y.o. G1P0 at [redacted]w[redacted]d presented for SOL.  S: No acute concerns  O:  BP 134/87   Pulse 79   Temp 98.3 F (36.8 C) (Oral)   Resp 16   Ht 5\' 7"  (1.702 m)   Wt 75.8 kg   SpO2 99%   BMI 26.16 kg/m  EFM: 140bpm/moderate/+accels  CVE: Dilation: 7 Effacement (%): 90 Cervical Position: Middle Station: 0 Presentation: Vertex Exam by:: Autry-Lott  A&P: 22 y.o. G1P0 [redacted]w[redacted]d here for SOL.  #Labor: Progressing well. Will consider pitocin if unchanged at next exam.  #Pain: Epidural #FWB: Cat I  #GBS negative  gHTN v. PreE Asymptomatic. Pre E labs wnl. UPC not done due to ROM.  -Continue to monitor -Labetalol protocol -lasix in PP period  [place] Autry-Lott, DO 11:12 AM

## 2022-07-09 NOTE — Anesthesia Preprocedure Evaluation (Signed)
Anesthesia Evaluation  Patient identified by MRN, date of birth, ID band Patient awake    Reviewed: Allergy & Precautions, NPO status , Patient's Chart, lab work & pertinent test results  Airway Mallampati: II  TM Distance: >3 FB Neck ROM: Full    Dental no notable dental hx.    Pulmonary neg pulmonary ROS   Pulmonary exam normal breath sounds clear to auscultation       Cardiovascular negative cardio ROS Normal cardiovascular exam Rhythm:Regular Rate:Normal     Neuro/Psych negative neurological ROS  negative psych ROS   GI/Hepatic negative GI ROS, Neg liver ROS,,,  Endo/Other  negative endocrine ROS    Renal/GU negative Renal ROS  negative genitourinary   Musculoskeletal negative musculoskeletal ROS (+)    Abdominal   Peds  Hematology negative hematology ROS (+)   Anesthesia Other Findings   Reproductive/Obstetrics (+) Pregnancy                             Anesthesia Physical Anesthesia Plan  ASA: 2  Anesthesia Plan: Epidural   Post-op Pain Management:    Induction:   PONV Risk Score and Plan: Treatment may vary due to age or medical condition  Airway Management Planned: Natural Airway  Additional Equipment:   Intra-op Plan:   Post-operative Plan:   Informed Consent: I have reviewed the patients History and Physical, chart, labs and discussed the procedure including the risks, benefits and alternatives for the proposed anesthesia with the patient or authorized representative who has indicated his/her understanding and acceptance.       Plan Discussed with: Anesthesiologist  Anesthesia Plan Comments: (Patient identified. Risks, benefits, options discussed with patient including but not limited to bleeding, infection, nerve damage, paralysis, failed block, incomplete pain control, headache, blood pressure changes, nausea, vomiting, reactions to medication, itching, and  post partum back pain. Confirmed with bedside nurse the patient's most recent platelet count. Confirmed with the patient that they are not taking any anticoagulation, have any bleeding history or any family history of bleeding disorders. Patient expressed understanding and wishes to proceed. All questions were answered. )       Anesthesia Quick Evaluation  

## 2022-07-09 NOTE — Discharge Summary (Shared)
Postpartum Discharge Summary  Date of Service updated***     Patient Name: Haley Leblanc DOB: 08/13/2000 MRN: [id number]  Date of admission: 07/09/2022 Delivery date:07/09/2022  Delivering provider: Lavonda Jumbo  Date of discharge: 07/09/2022  Admitting diagnosis: Normal labor [O80, Z37.9] Uterine contractions [O47.9] Labor without complication [O80] Intrauterine pregnancy: [redacted]w[redacted]d     Secondary diagnosis:  Principal Problem:   Labor without complication Active Problems:   Supervision of other normal pregnancy, antepartum   Short cervix during pregnancy in second trimester   Echogenic intracardiac focus of fetus on prenatal ultrasound  Additional problems: ***    Discharge diagnosis: {DX.:23714}                                              Post partum procedures:{Postpartum procedures:23558} Augmentation: Pitocin Complications: None  Hospital course: Onset of Labor With Vaginal Delivery      22 y.o. yo G1P0 at [redacted]w[redacted]d was admitted in Active Labor on 07/09/2022. Labor course was uncomplicated.   Membrane Rupture Time/Date: 7:38 AM ,07/09/2022   Delivery Method:Vaginal, Spontaneous  Episiotomy: None  Lacerations:    Patient had a postpartum course complicated by ***.  She is ambulating, tolerating a regular diet, passing flatus, and urinating well. Patient is discharged home in stable condition on 07/09/22.  Newborn Data: Birth date:07/09/2022  Birth time:3:43 PM  Gender:Female  Living status:Living  Apgars:9 ,9  Weight:   Magnesium Sulfate received: No BMZ received: No Rhophylac:No MMR:{MMR:30440033} T-DaP:{Tdap:23962} Flu: {YQM:57846} Transfusion:{Transfusion received:30440034}  Physical exam  Vitals:   07/09/22 1532 07/09/22 1602 07/09/22 1603 07/09/22 1617  BP: (!) 154/84 (!) 118/103 (!) 141/75 (!) 155/102  Pulse: 75 84 82 81  Resp: 18 16 16 16   Temp:      TempSrc:      SpO2:      Weight:      Height:       General: {Exam;  general:21111117} Lochia: {Desc; appropriate/inappropriate:30686::"appropriate"} Uterine Fundus: {Desc; firm/soft:30687} Incision: {Exam; incision:21111123} DVT Evaluation: {Exam; dvt:2111122} Labs: Lab Results  Component Value Date   WBC 6.8 07/09/2022   HGB 11.9 (L) 07/09/2022   HCT 33.9 (L) 07/09/2022   MCV 90.2 07/09/2022   PLT 135 (L) 07/09/2022      Latest Ref Rng & Units 07/09/2022    5:29 AM  CMP  Glucose 70 - 99 mg/dL 96   BUN 6 - 20 mg/dL 13   Creatinine 9.62 - 1.00 mg/dL 9.52   Sodium 841 - 324 mmol/L 134   Potassium 3.5 - 5.1 mmol/L 3.8   Chloride 98 - 111 mmol/L 105   CO2 22 - 32 mmol/L 22   Calcium 8.9 - 10.3 mg/dL 9.0   Total Protein 6.5 - 8.1 g/dL 6.8   Total Bilirubin 0.3 - 1.2 mg/dL 0.7   Alkaline Phos 38 - 126 U/L 135   AST 15 - 41 U/L 25   ALT 0 - 44 U/L 32    Edinburgh Score:     No data to display           After visit meds:  Allergies as of 07/09/2022   No Known Allergies   Med Rec must be completed prior to using this [facility]***        Discharge home in stable condition Infant Feeding: {Baby feeding:23562} Infant Disposition:{CHL IP OB HOME WITH MWNUUV:25366} Discharge instruction:  per After Visit Summary and Postpartum booklet. Activity: Advance as tolerated. Pelvic rest for 6 weeks.  Diet: {OB ZOXW:96045409} Future Appointments: Future Appointments  Date Time Provider Department Center  07/11/2022  1:15 PM [facility] US2 [facility]  07/11/2022  2:35 PM Federico Flake, MD [facility]  07/18/2022  1:15 PM [facility] US2 [facility]  07/18/2022  1:35 PM Federico Flake, MD [facility]   Follow up Visit:  Message sent to [facility] by Autry-Lott on 07/09/2022  Please schedule this patient for a In person postpartum visit in 6 weeks with the following provider: MD. Additional Postpartum F/U:BP check 1 week  Low risk pregnancy complicated by: [facility] Delivery mode:  Vaginal, Spontaneous  Anticipated Birth Control:  PP Nexplanon  placed***   07/09/2022 Kassi Esteve Autry-Lott, DO

## 2022-07-10 MED ORDER — NIFEDIPINE ER OSMOTIC RELEASE 30 MG PO TB24
30.0000 mg | ORAL_TABLET | Freq: Every day | ORAL | Status: DC
  Administered 2022-07-10 – 2022-07-11 (×2): 30 mg via ORAL
  Filled 2022-07-10 (×2): qty 1

## 2022-07-10 NOTE — Anesthesia Postprocedure Evaluation (Signed)
Anesthesia Post Note  Patient: Rana Snare  Procedure(s) Performed: AN AD HOC LABOR EPIDURAL     Patient location during evaluation: Mother Baby Anesthesia Type: Epidural Level of consciousness: awake, oriented and awake and alert Pain management: pain level controlled Vital Signs Assessment: post-procedure vital signs reviewed and stable Respiratory status: spontaneous breathing, respiratory function stable and nonlabored ventilation Cardiovascular status: stable Postop Assessment: no headache, adequate PO intake, able to ambulate, patient able to bend at knees and no apparent nausea or vomiting Anesthetic complications: no   No notable events documented.  Last Vitals:  Vitals:   07/10/22 0338 07/10/22 0624  BP: 127/70 (!) 141/80  Pulse: 90 67  Resp:    Temp: 36.7 C   SpO2: 98% 100%    Last Pain:  Vitals:   07/10/22 0624  TempSrc: Oral  PainSc: 0-No pain   Pain Goal: Patients Stated Pain Goal: 3 (07/09/22 0706)                 Avianna Moynahan

## 2022-07-10 NOTE — Lactation Note (Addendum)
This note was copied from a baby's chart. Lactation Consultation Note  Patient Name: Haley Leblanc [id number] Date: 07/10/2022 Age:21 hours Reason for consult: Initial assessment;Primapara;1st time breastfeeding;Term  Initial visit to 17 hours old infant. Demonstrated hand expression, collected ~3-mL of colostrum and spoonfed. Parent verbalized discomfort with technique, LC used manual pump for nipple eversion (right nipple is slightly short-shafted) collected ~5-mL. Talked about milk storage, frequency and care of parts. . Several attempts to latch unsuccessful. Infant seems uninterested and only holds nipple in mouth.  Discussed normal newborn behavior and patterns, signs of good milk transfer, hunger cues, tummy size and benefits of skin to skin.  Stork pump request submitted on behalf of dyad. Plan: 1-Breastfeeding on demand or 8-12 times in 24h period. 2-Use manual/ DEBP as needed 3-Encouraged birthing parent rest, hydration and food intake.   Contact LC as needed for feeds/support/concerns/questions. All questions answered at this time. Provided Lactation services brochure and promoted local resources.   Maternal Data Has patient been taught Hand Expression?: Yes Does the patient have breastfeeding experience prior to this delivery?: No  Feeding Mother's Current Feeding Choice: Breast Milk  LATCH Score Latch: Repeated attempts needed to sustain latch, nipple held in mouth throughout feeding, stimulation needed to elicit sucking reflex.  Audible Swallowing: None  Type of Nipple: Everted at rest and after stimulation  Comfort (Breast/Nipple): Soft / non-tender  Hold (Positioning): Assistance needed to correctly position infant at breast and maintain latch.  LATCH Score: 6   Lactation Tools Discussed/Used Tools: Pump;Flanges Breast pump type: Double-Electric Breast Pump;Manual Pump Education: Setup, frequency, and cleaning;Milk Storage Reason for Pumping: stimulation  and supplementation Pumping frequency: as needed Pumped volume: 5 mL (manual pump demonstration)  Interventions Interventions: Breast feeding basics reviewed;Assisted with latch;Skin to skin;Breast massage;Hand express;Breast compression;Adjust position;Support pillows;Position options;Expressed milk;Coconut oil;Hand pump;DEBP;Education;LC Services brochure  Discharge Pump: Manual;Stork Pump WIC Program: Yes  Consult Status Consult Status: Follow-up Date: 07/11/22 Follow-up type: In-patient    Chia Mowers A Higuera Ancidey 07/10/2022, 9:29 AM

## 2022-07-10 NOTE — Progress Notes (Signed)
POSTPARTUM PROGRESS NOTE  PPD#1  Subjective:  SUZANE CHALFIN is a 22 y.o. G1P1001 s/p NSVD at [redacted]w[redacted]d. Today she notes that she is doing well and reports no acute complaints. She denies any problems with ambulating, voiding or po intake. Denies nausea or vomiting. She has passed flatus, no BM.  Pain is well controlled.  Lochia appropriate Denies fever/chills/chest pain/SOB.  no HA, no blurry vision, no RUQ pain  Objective: Blood pressure (!) 141/80, pulse 67, temperature 98 F (36.7 C), temperature source Oral, resp. rate 17, height 5\' 7"  (1.702 m), weight 75.8 kg, SpO2 100 %, unknown if currently breastfeeding.  Physical Exam:  General: alert, cooperative and no distress Chest: no respiratory distress Heart: regular rate and rhythm Abdomen: soft, nontender Uterine Fundus: firm, appropriately tender DVT Evaluation: No calf swelling or tenderness Extremities: no edema Skin: warm, dry  Results for orders placed or performed during the hospital encounter of 07/09/22 (from the past 24 hour(s))  CBC     Status: Abnormal   Collection Time: 07/09/22  4:57 PM  Result Value Ref Range   WBC 11.1 (H) 4.0 - 10.5 K/uL   RBC 3.96 3.87 - 5.11 MIL/uL   Hemoglobin 12.0 12.0 - 15.0 g/dL   HCT 16.1 (L) 09.6 - 04.5 %   MCV 88.4 80.0 - 100.0 fL   MCH 30.3 26.0 - 34.0 pg   MCHC 34.3 30.0 - 36.0 g/dL   RDW 40.9 81.1 - 91.4 %   Platelets 135 (L) 150 - 400 K/uL   nRBC 0.0 0.0 - 0.2 %    Assessment/Plan: ALEISHA LONGWELL is a 22 y.o. G1P1001 s/p NSVD at [redacted]w[redacted]d PPD#1 1) Gestational HTN -on lasix, adding Procardia XL 30mg  daily -Currently asymptomatic  2) postpartum care -Pain well-controlled -Encourage ambulation as tolerated   contraception: Desires Nexplanon Feeding: Breast  Dispo: Continue routine postpartum care.  Will plan to monitor BPs and likely discharge tomorrow   LOS: 1 day   Myna Hidalgo, DO Faculty Attending, Center for [facility] 07/10/2022, 10:39 AM

## 2022-07-10 NOTE — Plan of Care (Signed)
  Problem: Clinical Measurements: Goal: Ability to maintain clinical measurements within normal limits will improve Outcome: Progressing Goal: Will remain free from infection Outcome: Progressing Goal: Diagnostic test results will improve Outcome: Progressing Goal: Respiratory complications will improve Outcome: Progressing Goal: Cardiovascular complication will be avoided Outcome: Progressing   Problem: Activity: Goal: Risk for activity intolerance will decrease Outcome: Progressing   Problem: Nutrition: Goal: Adequate nutrition will be maintained Outcome: Progressing   Problem: Coping: Goal: Level of anxiety will decrease Outcome: Progressing   Problem: Elimination: Goal: Will not experience complications related to bowel motility Outcome: Progressing Goal: Will not experience complications related to urinary retention Outcome: Progressing   Problem: Pain Managment: Goal: General experience of comfort will improve Outcome: Progressing   Problem: Safety: Goal: Ability to remain free from injury will improve Outcome: Progressing   Problem: Skin Integrity: Goal: Risk for impaired skin integrity will decrease Outcome: Progressing   Problem: Education: Goal: Knowledge of condition will improve Outcome: Progressing Goal: Individualized Educational Video(s) Outcome: Progressing Goal: Individualized Newborn Educational Video(s) Outcome: Progressing   Problem: Activity: Goal: Will verbalize the importance of balancing activity with adequate rest periods Outcome: Progressing Goal: Ability to tolerate increased activity will improve Outcome: Progressing   Problem: Coping: Goal: Ability to identify and utilize available resources and services will improve Outcome: Progressing   Problem: Life Cycle: Goal: Chance of risk for complications during the postpartum period will decrease Outcome: Progressing   Problem: Role Relationship: Goal: Ability to demonstrate  positive interaction with newborn will improve Outcome: Progressing   Problem: Skin Integrity: Goal: Demonstration of wound healing without infection will improve Outcome: Progressing

## 2022-07-11 ENCOUNTER — Other Ambulatory Visit (HOSPITAL_COMMUNITY): Payer: Self-pay

## 2022-07-11 ENCOUNTER — Other Ambulatory Visit: Payer: Medicaid Other

## 2022-07-11 ENCOUNTER — Encounter: Payer: Medicaid Other | Admitting: Family Medicine

## 2022-07-11 DIAGNOSIS — Z30017 Encounter for initial prescription of implantable subdermal contraceptive: Secondary | ICD-10-CM

## 2022-07-11 HISTORY — DX: Encounter for initial prescription of implantable subdermal contraceptive: Z30.017

## 2022-07-11 MED ORDER — ACETAMINOPHEN 325 MG PO TABS
650.0000 mg | ORAL_TABLET | ORAL | 0 refills | Status: DC | PRN
  Filled 2022-07-11: qty 100, 9d supply, fill #0

## 2022-07-11 MED ORDER — IBUPROFEN 600 MG PO TABS
600.0000 mg | ORAL_TABLET | Freq: Four times a day (QID) | ORAL | 0 refills | Status: DC
  Filled 2022-07-11: qty 30, 8d supply, fill #0

## 2022-07-11 MED ORDER — NIFEDIPINE ER 30 MG PO TB24
30.0000 mg | ORAL_TABLET | Freq: Every day | ORAL | 1 refills | Status: DC
  Filled 2022-07-11: qty 30, 30d supply, fill #0

## 2022-07-11 MED ORDER — FUROSEMIDE 20 MG PO TABS
20.0000 mg | ORAL_TABLET | Freq: Every day | ORAL | 0 refills | Status: DC
  Filled 2022-07-11: qty 3, 3d supply, fill #0

## 2022-07-11 MED ORDER — LIDOCAINE HCL 1 % IJ SOLN: 0.0000 mL | Freq: Once | INTRAMUSCULAR | Status: DC | PRN

## 2022-07-11 MED ORDER — LIDOCAINE HCL 1 % IJ SOLN
INTRAMUSCULAR | Status: AC
  Administered 2022-07-11: 5 mL
  Filled 2022-07-11: qty 20

## 2022-07-11 MED ORDER — ETONOGESTREL 68 MG ~~LOC~~ IMPL
68.0000 mg | DRUG_IMPLANT | Freq: Once | SUBCUTANEOUS | Status: AC
  Administered 2022-07-11: 68 mg via SUBCUTANEOUS
  Filled 2022-07-11: qty 1

## 2022-07-11 NOTE — Procedures (Signed)
     GYNECOLOGY OFFICE PROCEDURE NOTE  CANSAS ELL is a 22 y.o. G1P1001 here for Nexplanon insertion. This is an inpatient postpartum nexplanon insertion.  No other gynecologic concerns.  Nexplanon Insertion Procedure Patient identified, informed consent performed, consent signed.   Patient does understand that irregular bleeding is a very common side effect of this medication. She was advised to have backup contraception for one week after placement. Pregnancy test in clinic today was negative.  Appropriate time out taken.  Patient's left arm was prepped and draped in the usual sterile fashion. The ruler used to measure and mark insertion area.  Patient was prepped with alcohol swab and then injected with 3 ml of 1% lidocaine.  She was prepped with betadine, Nexplanon removed from packaging,  Device confirmed in needle, then inserted full length of needle and withdrawn per handbook instructions. Nexplanon was able to palpated in the patient's arm; patient palpated the insert herself. There was minimal blood loss.  Patient insertion site covered with guaze and a pressure bandage to reduce any bruising.  The patient tolerated the procedure well and was given post procedure instructions.      Mariel Aloe, MD, FACOG Obstetrician & Gynecologist, [facility] for [facility], [facility]

## 2022-07-11 NOTE — Lactation Note (Signed)
This note was copied from a baby's chart. Lactation Consultation Note  Patient Name: Haley Leblanc [id number] Date: 07/11/2022 Age:21 hours Reason for consult: 1st time breastfeeding;Primapara;Follow-up assessment;Term  P1, [redacted]w[redacted]d, 4% weight loss  Mother reports baby is breastfeeding well and she is expressing milk and feeding by bottle. Denies any questions or concerns.  Mom made aware of O/P services, breastfeeding support groups, and our phone # for post-discharge questions.    Feeding Mother's Current Feeding Choice: Breast Milk     Lactation Tools Discussed/Used Pumped volume: 20 mL  Interventions Interventions: Education     Consult Status Consult Status: Complete Date: 07/11/22    Omar Person 07/11/2022, 12:38 PM

## 2022-07-13 ENCOUNTER — Encounter: Payer: Self-pay | Admitting: Family Medicine

## 2022-07-18 ENCOUNTER — Encounter: Payer: Medicaid Other | Admitting: Family Medicine

## 2022-07-18 ENCOUNTER — Encounter (HOSPITAL_COMMUNITY): Payer: Medicaid Other

## 2022-07-18 ENCOUNTER — Other Ambulatory Visit: Payer: Medicaid Other

## 2022-07-18 ENCOUNTER — Other Ambulatory Visit: Payer: Self-pay

## 2022-07-18 ENCOUNTER — Ambulatory Visit (INDEPENDENT_AMBULATORY_CARE_PROVIDER_SITE_OTHER): Payer: Medicaid Other

## 2022-07-18 VITALS — BP 127/70 | HR 88 | Wt 151.5 lb

## 2022-07-18 DIAGNOSIS — Z0130 Encounter for examination of blood pressure without abnormal findings: Secondary | ICD-10-CM

## 2022-07-18 DIAGNOSIS — Z0289 Encounter for other administrative examinations: Secondary | ICD-10-CM

## 2022-07-18 NOTE — Progress Notes (Signed)
Blood Pressure Check Visit  Haley Leblanc is here for blood pressure check following spontaneous vaginal delivery on 07/08/21. Dx of gestational hypertension. Lasix 20 mg daily for 3 days completed following discharge. Taking Nifedipine 30 mg daily. BP today is 127/70. Reports headache every other day that resolves fully with Tylenol or ibuprofen  Home BP logged in Babyscripts:    Encouraged pt to continue checking BP and logging into Babyscripts. Reviewed it is important to check with any headache that does not improve with medication. Will follow up at [facility] visit or before if any elevated BP at home.   Marjo Bicker, RN 07/18/2022  2:15 PM

## 2022-07-20 ENCOUNTER — Encounter: Payer: Self-pay | Admitting: Family Medicine

## 2022-07-21 ENCOUNTER — Inpatient Hospital Stay (HOSPITAL_COMMUNITY): Payer: Medicaid Other

## 2022-07-21 ENCOUNTER — Inpatient Hospital Stay (HOSPITAL_COMMUNITY): Admission: RE | Admit: 2022-07-21 | Payer: Medicaid Other | Source: Home / Self Care | Admitting: Family Medicine

## 2022-07-24 ENCOUNTER — Encounter: Payer: Self-pay | Admitting: Obstetrics & Gynecology

## 2022-08-10 NOTE — Progress Notes (Signed)
Post Partum Visit Note  Haley Leblanc is a 22 y.o. G55P1001 female who presents for a postpartum visit. She is 4 weeks postpartum following a normal spontaneous vaginal delivery.  I have fully reviewed the prenatal and intrapartum course. The delivery was at 39.5 gestational weeks.  Anesthesia: epidural. Postpartum course has been uneventful, had BP checks that were normal. Baby is doing well. Baby is feeding by breast. Bleeding no bleeding. Bowel function is normal. Bladder function is normal. Patient is not sexually active. Contraception method is Nexplanon. Postpartum depression screening: negative.   The pregnancy intention screening data noted above was reviewed. Potential methods of contraception were discussed. The patient elected to proceed with No data recorded.   Edinburgh Postnatal Depression Scale - 08/11/22 1045       Edinburgh Postnatal Depression Scale:  In the Past 7 Days   I have been able to laugh and see the funny side of things. 0    I have looked forward with enjoyment to things. 0    I have blamed myself unnecessarily when things went wrong. 0    I have been anxious or worried for no good reason. 0    I have felt scared or panicky for no good reason. 0    Things have been getting on top of me. 0    I have been so unhappy that I have had difficulty sleeping. 0    I have felt sad or miserable. 0    I have been so unhappy that I have been crying. 0    The thought of harming myself has occurred to me. 0    Edinburgh Postnatal Depression Scale Total 0             Health Maintenance Due  Topic Date Due   HPV VACCINES (1 - 3-dose series) Never done   COVID-19 Vaccine (1 - 2023-24 season) Never done    The following portions of the patient's history were reviewed and updated as appropriate: allergies, current medications, past family history, past medical history, past social history, past surgical history, and problem list.  Review of Systems Pertinent  items noted in HPI and remainder of comprehensive ROS otherwise negative.  Objective:  BP 116/75   Pulse 77   Ht 5' 7.5" (1.715 m)   Wt 144 lb (65.3 kg)   LMP  (LMP Unknown)   Breastfeeding Yes   BMI 22.22 kg/m    General:  alert, cooperative, and appears stated age   Breasts:  not indicated  Lungs: Comfortalbe on room air  Wound N/a  GU exam:  not indicated        Assessment:   No diagnosis found.   Normal postpartum exam.   Plan:   Essential components of care per ACOG recommendations:  1.  Mood and well being: Patient with negative depression screening today. Reviewed local resources for support.  - Patient tobacco use? No.   - hx of drug use? No.    2. Infant care and feeding:  -Patient currently breastmilk feeding? Yes. Reviewed importance of draining breast regularly to support lactation.  -Social determinants of health (SDOH) reviewed in EPIC. No concerns  3. Sexuality, contraception and birth spacing - Patient does not want a pregnancy in the next year.  Desired family size is 3 children.  - Reviewed reproductive life planning. Reviewed contraceptive methods based on pt preferences and effectiveness.  Patient had a nexplanon placed prior to discharge.   - Discussed birth  spacing of 18 months  4. Sleep and fatigue -Encouraged family/partner/community support of 4 hrs of uninterrupted sleep to help with mood and fatigue  5. Physical Recovery  - Discussed patients delivery and complications. She describes her labor as good. - Patient had a Vaginal, no problems at delivery. Patient had a 1st degree laceration. Perineal healing reviewed. Patient expressed understanding - Patient has urinary incontinence? No. - Patient is safe to resume physical and sexual activity  6.  Health Maintenance - HM due items addressed No - up to date - Last pap smear No results found for: "DIAGPAP" Pap smear not done at today's visit.  -Breast Cancer screening indicated? No.   7.  Chronic Disease/Pregnancy Condition follow up: Hypertension - Gestational HTN: normotensive on Nifedipine, instructed to stop, will recheck her BP at next [facility]. Discussed need for ASA prophylaxis in future pregnancies and increased lifetime risk of HTN.    Venora Maples, MD/MPH Attending Family Medicine Physician, [facility] for [facility], [facility]

## 2022-08-11 ENCOUNTER — Other Ambulatory Visit: Payer: Self-pay

## 2022-08-11 ENCOUNTER — Ambulatory Visit (INDEPENDENT_AMBULATORY_CARE_PROVIDER_SITE_OTHER): Payer: Medicaid Other | Admitting: Family Medicine

## 2022-08-11 ENCOUNTER — Encounter: Payer: Self-pay | Admitting: Family Medicine

## 2022-08-11 DIAGNOSIS — Z8759 Personal history of other complications of pregnancy, childbirth and the puerperium: Secondary | ICD-10-CM

## 2022-08-26 ENCOUNTER — Encounter: Payer: Self-pay | Admitting: Family Medicine

## 2022-09-12 ENCOUNTER — Encounter: Payer: Self-pay | Admitting: Family Medicine

## 2022-09-25 ENCOUNTER — Encounter: Payer: Self-pay | Admitting: Obstetrics & Gynecology

## 2022-10-10 ENCOUNTER — Encounter: Payer: Self-pay | Admitting: Family Medicine

## 2022-10-10 ENCOUNTER — Ambulatory Visit (INDEPENDENT_AMBULATORY_CARE_PROVIDER_SITE_OTHER): Payer: Medicaid Other | Admitting: Family Medicine

## 2022-10-10 ENCOUNTER — Ambulatory Visit: Payer: Medicaid Other | Admitting: Obstetrics & Gynecology

## 2022-10-10 ENCOUNTER — Other Ambulatory Visit: Payer: Self-pay

## 2022-10-10 VITALS — BP 113/62 | HR 71 | Ht 67.0 in | Wt 140.4 lb

## 2022-10-10 DIAGNOSIS — Z124 Encounter for screening for malignant neoplasm of cervix: Secondary | ICD-10-CM

## 2022-10-10 NOTE — Progress Notes (Signed)
    Contraception/Family Planning VISIT ENCOUNTER NOTE  Subjective:   Haley Leblanc is a 22 y.o. G58P1001 female here for reproductive life counseling.  Having daily HA with nexplanon. Reports she does not want a pregnancy in the next year. Denies abnormal vaginal bleeding, discharge, pelvic pain, problems with intercourse or other gynecologic concerns.    Gynecologic History No LMP recorded (lmp unknown). Patient has had an implant. Contraception: Nexplanon  Health Maintenance Due  Topic Date Due   HPV VACCINES (1 - 3-dose series) Never done   INFLUENZA VACCINE  Never done   Cervical Cancer Screening (Pap smear)  Never done   COVID-19 Vaccine (1 - 2023-24 season) Never done   The following portions of the patient's history were reviewed and updated as appropriate: allergies, current medications, past family history, past medical history, past social history, past surgical history and problem list.  Review of Systems Pertinent items are noted in HPI.   Objective:  BP 113/62   Pulse 71   Ht 5\' 7"  (1.702 m)   Wt 140 lb 6.4 oz (63.7 kg)   LMP  (LMP Unknown)   Breastfeeding Yes   BMI 21.99 kg/m  Gen: well appearing, NAD HEENT: no scleral icterus CV: RR Lung: Normal WOB Ext: warm well perfused   Assessment and Plan:   Contraception counseling: desires removal of nexplanon. Plans to use condoms and NFP. Counseled that she will return to normal hormone levels at about 48hrs and should use condoms with all sex until period resumes unless desiring pregnancy.  1. Cervical cancer screening -Patient declined pap today and would like to schedule for future appointment  Please refer to After Visit Summary for other counseling recommendations.   Return in about 3 months (around 01/09/2023) for Yearly wellness exam.  No future appointments.   Federico Flake, MD, MPH, ABFM Attending Physician Faculty Practice- Center for [facility]

## 2022-11-28 ENCOUNTER — Encounter: Payer: Self-pay | Admitting: Advanced Practice Midwife

## 2023-06-29 ENCOUNTER — Encounter: Payer: Self-pay | Admitting: Family Medicine

## 2023-07-03 ENCOUNTER — Encounter: Payer: Self-pay | Admitting: Family Medicine

## 2023-07-08 ENCOUNTER — Ambulatory Visit
Admission: EM | Admit: 2023-07-08 | Discharge: 2023-07-08 | Disposition: A | Discharge status: Home / Self Care | Attending: Emergency Medicine | Admitting: Emergency Medicine

## 2023-07-08 DIAGNOSIS — Z113 Encounter for screening for infections with a predominantly sexual mode of transmission: Secondary | ICD-10-CM | POA: Insufficient documentation

## 2023-07-08 DIAGNOSIS — K649 Unspecified hemorrhoids: Secondary | ICD-10-CM | POA: Diagnosis present

## 2023-07-08 DIAGNOSIS — Z3202 Encounter for pregnancy test, result negative: Secondary | ICD-10-CM | POA: Insufficient documentation

## 2023-07-08 DIAGNOSIS — N898 Other specified noninflammatory disorders of vagina: Secondary | ICD-10-CM | POA: Diagnosis present

## 2023-07-08 LAB — POCT URINE PREGNANCY: Preg Test, Ur: NEGATIVE

## 2023-07-08 NOTE — Discharge Instructions (Addendum)
 We will call you if anything on your swab returns positive. You can also see these results on MyChart. Please abstain from sexual intercourse until your results return.  For the hemorrhoid you can try Preparation H (hydrocortisone cream) applied 4 times daily to help shrink. Do warm water soaks frequently I recommend a stool softener such as MiraLAX, and drink LOTS of water  Please call your ob/gyn to make an appointment for follow up and pap smear

## 2023-07-08 NOTE — ED Triage Notes (Signed)
 Pt present or STD testing.   Pt states she has vaginal swelling and vaginal clear discharge.

## 2023-07-08 NOTE — ED Provider Notes (Signed)
 [facility]    CSN: [id number] Arrival date & time: 07/08/23  0809      History   Chief Complaint Chief Complaint  Patient presents with   SEXUALLY TRANSMITTED DISEASE    HPI Haley Leblanc is a 23 y.o. female.  Here for STD testing She reports a few days of clear vaginal discharge No itching or odor Sexually active, denies known exposure  Denies urinary symptoms LMP 5/26 She thought there was a swollen lymph node in her groin. Reports history of this that went away   At discharge she also mentions an external hemorrhoid that's been present for about 5 days. She would like to know how to treat it. Denies any blood in stool, straining or hard stool. No pain with sitting. She reports anal intercourse 2 days ago G1P1 vaginal delivery one year ago   No past medical history on file.  Patient Active Problem List   Diagnosis Date Noted   History of gestational hypertension 08/11/2022   Insertion of Nexplanon  07/11/2022   Short cervix during pregnancy in second trimester 03/02/2022   Carrier of spinal muscular atrophy 02/23/2022    Past Surgical History:  Procedure Laterality Date   right wrist surgery      OB History     Gravida  1   Para  1   Term  1   Preterm      AB      Living  1      SAB      IAB      Ectopic      Multiple  0   Live Births  1            Home Medications    Prior to Admission medications   Not on File    Family History Family History  Problem Relation Age of Onset   Asthma Neg Hx    Diabetes Neg Hx    Heart disease Neg Hx    Hypertension Neg Hx    Stroke Neg Hx     Social History Social History   Tobacco Use   Smoking status: Never    Passive exposure: Yes  Vaping Use   Vaping status: Never Used  Substance Use Topics   Alcohol use: Not Currently   Drug use: Not Currently     Allergies   Patient has no known allergies.   Review of Systems Review of Systems  As per  HPI  Physical Exam Triage Vital Signs ED Triage Vitals  Encounter Vitals Group     BP      Girls Systolic BP Percentile      Girls Diastolic BP Percentile      Boys Systolic BP Percentile      Boys Diastolic BP Percentile      Pulse      Resp      Temp      Temp src      SpO2      Weight      Height      Head Circumference      Peak Flow      Pain Score      Pain Loc      Pain Education      Exclude from Growth Chart    No data found.  Updated Vital Signs BP 118/74 (BP Location: Right Arm)   Pulse 95   Temp (!) 97.5 F (36.4 C) (Oral)   Resp 18  LMP 06/19/2023 (Exact Date)   SpO2 96%   Breastfeeding No    Physical Exam Vitals and nursing note reviewed.  Constitutional:      General: She is not in acute distress.    Appearance: Normal appearance.  HENT:     Mouth/Throat:     Pharynx: Oropharynx is clear.   Cardiovascular:     Rate and Rhythm: Normal rate and regular rhythm.     Pulses: Normal pulses.     Heart sounds: Normal heart sounds.  Pulmonary:     Effort: Pulmonary effort is normal.     Breath sounds: Normal breath sounds.  Abdominal:     General: There is no distension.     Palpations: Abdomen is soft.     Tenderness: There is no abdominal tenderness. There is no right CVA tenderness, left CVA tenderness or guarding.  Genitourinary:    Comments: Deferred  Lymphadenopathy:     Upper Body:     Right upper body: No axillary adenopathy.     Left upper body: No axillary adenopathy.     Lower Body: No right inguinal adenopathy. No left inguinal adenopathy.   Neurological:     Mental Status: She is alert and oriented to person, place, and time.     UC Treatments / Results  Labs (all labs ordered are listed, but only abnormal results are displayed) Labs Reviewed  POCT URINE PREGNANCY  CERVICOVAGINAL ANCILLARY ONLY    EKG  Radiology No results found.  Procedures Procedures (including critical care time)  Medications Ordered in  UC Medications - No data to display  Initial Impression / Assessment and Plan / UC Course  I have reviewed the triage vital signs and the nursing notes.  Pertinent labs & imaging results that were available during my care of the patient were reviewed by me and considered in my medical decision making (see chart for details).  UPT negative Cytology swab pending. Treat positive result as indicated Advised follow up with ob/gyn for routine checkup including pap smear  No LAD on exam today  Discussed hemorrhoid, possible causes and treatments. Recommend preparation H, warm water soaks, stool softener, increase fiber and fluids. Discussed red flag symptoms to have evaluated like severe pain, inability to have a BM, heavy bleeding, etc. Further information provided in AVS. Patient agrees to plan, no questions at this time  Final Clinical Impressions(s) / UC Diagnoses   Final diagnoses:  Vaginal discharge  Screen for STD (sexually transmitted disease)  Hemorrhoids, unspecified hemorrhoid type  Negative pregnancy test     Discharge Instructions      We will call you if anything on your swab returns positive. You can also see these results on MyChart. Please abstain from sexual intercourse until your results return.  For the hemorrhoid you can try Preparation H (hydrocortisone cream) applied 4 times daily to help shrink. Do warm water soaks frequently I recommend a stool softener such as MiraLAX, and drink LOTS of water  Please call your ob/gyn to make an appointment for follow up and pap smear     ED Prescriptions   None    PDMP not reviewed this encounter.   Creighton Doffing, [place] 07/08/23 [phone]

## 2023-07-10 ENCOUNTER — Ambulatory Visit (HOSPITAL_COMMUNITY): Payer: Self-pay

## 2023-07-10 LAB — CERVICOVAGINAL ANCILLARY ONLY
Bacterial Vaginitis (gardnerella): POSITIVE — AB
Candida Glabrata: NEGATIVE
Candida Vaginitis: POSITIVE — AB
Chlamydia: NEGATIVE
Comment: NEGATIVE
Comment: NEGATIVE
Comment: NEGATIVE
Comment: NEGATIVE
Comment: NEGATIVE
Comment: NORMAL
Neisseria Gonorrhea: NEGATIVE
Trichomonas: NEGATIVE

## 2023-07-10 MED ORDER — METRONIDAZOLE 500 MG PO TABS: 500.0000 mg | ORAL_TABLET | Freq: Two times a day (BID) | ORAL | 0 refills | Status: AC

## 2023-07-10 MED ORDER — FLUCONAZOLE 150 MG PO TABS: 150.0000 mg | ORAL_TABLET | Freq: Once | ORAL | 0 refills | Status: AC

## 2023-10-03 ENCOUNTER — Ambulatory Visit: Admitting: *Deleted

## 2023-10-03 DIAGNOSIS — Z3201 Encounter for pregnancy test, result positive: Secondary | ICD-10-CM

## 2023-10-03 DIAGNOSIS — Z3687 Encounter for antenatal screening for uncertain dates: Secondary | ICD-10-CM

## 2023-10-03 DIAGNOSIS — Z3200 Encounter for pregnancy test, result unknown: Secondary | ICD-10-CM

## 2023-10-03 LAB — POCT PREGNANCY, URINE: Preg Test, Ur: POSITIVE — AB

## 2023-10-03 NOTE — Patient Instructions (Signed)

## 2023-10-03 NOTE — Progress Notes (Signed)
 Haley Leblanc dropped off a urine for a upt which was positive. I called and informed her of positive report. She reports her LMP was 07/15/23 but she thinks she had one in July but not sure of date. I informed her per LMP of 07/15/23 her EDD would be 04/20/24 and she would be [redacted]w[redacted]d. She states her flow app is telling her EDD is 05/19/24 and she can't see dates for July period. She plans to go here for her prenatal care as she is already a patient here.  I offered her dating US  to clarify EDD and she agreed to appointment tomorrow. I reveiewed allergies and medications and advised to start prenatal vitamin. I explained once we determine her EDD she can schedule a new ob visit. She voices understanding/ Rock Skip PEAK

## 2023-10-04 ENCOUNTER — Other Ambulatory Visit: Payer: Self-pay | Admitting: Obstetrics & Gynecology

## 2023-10-04 ENCOUNTER — Ambulatory Visit (INDEPENDENT_AMBULATORY_CARE_PROVIDER_SITE_OTHER)

## 2023-10-04 ENCOUNTER — Other Ambulatory Visit: Payer: Self-pay

## 2023-10-04 DIAGNOSIS — Z3687 Encounter for antenatal screening for uncertain dates: Secondary | ICD-10-CM

## 2023-10-04 DIAGNOSIS — Z3200 Encounter for pregnancy test, result unknown: Secondary | ICD-10-CM

## 2023-10-04 DIAGNOSIS — Z3A01 Less than 8 weeks gestation of pregnancy: Secondary | ICD-10-CM | POA: Diagnosis not present

## 2023-10-16 ENCOUNTER — Encounter: Payer: Self-pay | Admitting: Advanced Practice Midwife

## 2023-10-18 ENCOUNTER — Ambulatory Visit
Admission: EM | Admit: 2023-10-18 | Discharge: 2023-10-18 | Disposition: A | Discharge status: Home / Self Care | Source: Ambulatory Visit | Attending: Family Medicine | Admitting: Family Medicine

## 2023-10-18 DIAGNOSIS — R0602 Shortness of breath: Secondary | ICD-10-CM

## 2023-10-18 DIAGNOSIS — R519 Headache, unspecified: Secondary | ICD-10-CM | POA: Diagnosis not present

## 2023-10-18 DIAGNOSIS — Z3491 Encounter for supervision of normal pregnancy, unspecified, first trimester: Secondary | ICD-10-CM

## 2023-10-18 NOTE — ED Triage Notes (Signed)
 Pt present with sob and migraines that have been worsening since July. Pt states the symptoms have worsened.   States she felt she almost passed out earlier. Pt states she is [redacted] wks pregnant.

## 2023-10-18 NOTE — ED Provider Notes (Signed)
 [facility]    CSN: [id number] Arrival date & time: 10/18/23  1441      History   Chief Complaint Chief Complaint  Patient presents with   Near Syncope   Migraine   Shortness of Breath    HPI Haley Leblanc is a 23 y.o. female with a past medical history of migraines presents for headaches, shortness of breath, near syncope.  Patient is currently [redacted] weeks pregnant.  She states since July she has been having shortness of breath and headaches that over the past couple weeks seem to have worsened.  She states she is short of breath at rest and with activity.  States today she got really really hot while driving her car and felt like she was going to pass out but denies actual syncope.  She denies any chest pain, unilateral weakness, lower extremity swelling. reports she has a global headache that is her typical presentation for migraines.  She currently rates it as a 9 out of 10 but denies worst headache of life.  States she normally just takes Tylenol  for headaches but she has not taken anything today.  She also states she has a history of chronic nosebleeds for which she was supposed to get some type of nasal surgery in the past but did not for unclear reasons.  She states she has some blood-tinged sputum intermittently.  No vaginal bleeding.  She called her OB office and was told to go to urgent care.  No other concerns at this time.   Near Syncope Associated symptoms include headaches and shortness of breath.  Migraine Associated symptoms include headaches and shortness of breath.  Shortness of Breath Associated symptoms: headaches     History reviewed. No pertinent past medical history.  Patient Active Problem List   Diagnosis Date Noted   History of gestational hypertension 08/11/2022   Insertion of Nexplanon  07/11/2022   Short cervix during pregnancy in second trimester 03/02/2022   Carrier of spinal muscular atrophy 02/23/2022    Past Surgical History:   Procedure Laterality Date   right wrist surgery      OB History     Gravida  2   Para  1   Term  1   Preterm      AB      Living  1      SAB      IAB      Ectopic      Multiple  0   Live Births  1            Home Medications    Prior to Admission medications   Not on File    Family History Family History  Problem Relation Age of Onset   Asthma Neg Hx    Diabetes Neg Hx    Heart disease Neg Hx    Hypertension Neg Hx    Stroke Neg Hx     Social History Social History   Tobacco Use   Smoking status: Never    Passive exposure: Yes  Vaping Use   Vaping status: Never Used  Substance Use Topics   Alcohol use: Not Currently   Drug use: Not Currently     Allergies   Patient has no known allergies.   Review of Systems Review of Systems  Respiratory:  Positive for shortness of breath.   Cardiovascular:  Positive for near-syncope.  Neurological:  Positive for dizziness and headaches.       Near syncope  Physical Exam Triage Vital Signs ED Triage Vitals [10/18/23 1452]  Encounter Vitals Group     BP 116/70     Girls Systolic BP Percentile      Girls Diastolic BP Percentile      Boys Systolic BP Percentile      Boys Diastolic BP Percentile      Pulse Rate 87     Resp 18     Temp 98.8 F (37.1 C)     Temp Source Oral     SpO2 98 %     Weight      Height      Head Circumference      Peak Flow      Pain Score      Pain Loc      Pain Education      Exclude from Growth Chart    No data found.  Updated Vital Signs BP 116/70 (BP Location: Right Arm)   Pulse 87   Temp 98.8 F (37.1 C) (Oral)   Resp 18   LMP 07/15/2023 (Exact Date)   SpO2 98%   Breastfeeding No   Visual Acuity Right Eye Distance:   Left Eye Distance:   Bilateral Distance:    Right Eye Near:   Left Eye Near:    Bilateral Near:     Physical Exam Vitals and nursing note reviewed.  Constitutional:      General: She is not in acute distress.     Appearance: Normal appearance. She is not ill-appearing.  HENT:     Head: Normocephalic and atraumatic.  Eyes:     Extraocular Movements: Extraocular movements intact.     Conjunctiva/sclera: Conjunctivae normal.     Pupils: Pupils are equal, round, and reactive to light.  Cardiovascular:     Rate and Rhythm: Normal rate and regular rhythm.     Heart sounds: Normal heart sounds.  Pulmonary:     Effort: Pulmonary effort is normal.     Breath sounds: Normal breath sounds. No stridor. No wheezing, rhonchi or rales.  Skin:    General: Skin is warm and dry.  Neurological:     General: No focal deficit present.     Mental Status: She is alert and oriented to person, place, and time.  Psychiatric:        Mood and Affect: Mood normal.        Behavior: Behavior normal.      UC Treatments / Results  Labs (all labs ordered are listed, but only abnormal results are displayed) Labs Reviewed - No data to display  EKG   Radiology No results found.  Procedures Procedures (including critical care time)  Medications Ordered in UC Medications - No data to display  Initial Impression / Assessment and Plan / UC Course  I have reviewed the triage vital signs and the nursing notes.  Pertinent labs & imaging results that were available during my care of the patient were reviewed by me and considered in my medical decision making (see chart for details).     I reviewed limitations and abilities of urgent care.  Given patient's symptoms and that she is [redacted] weeks pregnant I advise she go to the emergency room for further workup/treatment.  She is in agreement with plan will go POV to the emergency room.  She was instructed to pull over and call 911 for any worsening symptoms that occur in transit and she verbalized understanding. Final Clinical Impressions(s) / UC Diagnoses   Final  diagnoses:  Shortness of breath  First trimester pregnancy  Acute intractable headache, unspecified headache  type   Discharge Instructions   None    ED Prescriptions   None    PDMP not reviewed this encounter.   Loreda Myla SAUNDERS, NP 10/18/23 [phone]

## 2023-10-18 NOTE — Discharge Instructions (Signed)
 Please go to the emergency room for further evaluation of your symptoms

## 2023-10-25 ENCOUNTER — Encounter: Payer: Self-pay | Admitting: Advanced Practice Midwife

## 2023-11-02 ENCOUNTER — Telehealth (INDEPENDENT_AMBULATORY_CARE_PROVIDER_SITE_OTHER)

## 2023-11-02 DIAGNOSIS — Z3490 Encounter for supervision of normal pregnancy, unspecified, unspecified trimester: Secondary | ICD-10-CM | POA: Insufficient documentation

## 2023-11-02 DIAGNOSIS — Z8759 Personal history of other complications of pregnancy, childbirth and the puerperium: Secondary | ICD-10-CM

## 2023-11-02 DIAGNOSIS — Z3A11 11 weeks gestation of pregnancy: Secondary | ICD-10-CM | POA: Diagnosis not present

## 2023-11-02 DIAGNOSIS — Z3491 Encounter for supervision of normal pregnancy, unspecified, first trimester: Secondary | ICD-10-CM | POA: Diagnosis not present

## 2023-11-02 DIAGNOSIS — Z148 Genetic carrier of other disease: Secondary | ICD-10-CM

## 2023-11-02 NOTE — Progress Notes (Signed)
 New OB Intake  I connected with Haley Leblanc  on 11/02/23 at 10:15 AM EDT by MyChart Video Visit and verified that I am speaking with the correct person using two identifiers. Nurse is located at [facility] and pt is located at Home.  I discussed the limitations, risks, security and privacy concerns of performing an evaluation and management service by telephone and the availability of in person appointments. I also discussed with the patient that there may be a patient responsible charge related to this service. The patient expressed understanding and agreed to proceed.  I explained I am completing New OB Intake today. We discussed EDD of 05/21/24 based on US  at 7 weeks. Pt is G2P1001. I reviewed her allergies, medications and Medical/Surgical/OB history.    Patient Active Problem List   Diagnosis Date Noted   Encounter for supervision of low-risk pregnancy, antepartum 11/02/2023   History of gestational hypertension 08/11/2022   Carrier of spinal muscular atrophy 02/23/2022     Concerns addressed today Patient had no concerns.  Delivery Plans Plans to deliver at [facility]. Discussed the nature of our practice with multiple providers including residents and students as well as female and female providers. Due to the size of the practice, the delivering provider may not be the same as those providing prenatal care.   Patient is interested in water birth.  MyChart/Babyscripts MyChart access verified. I explained pt will have some visits in office and some virtually. Babyscripts instructions given and order placed. Patient verifies receipt of registration text/e-mail.   Blood Pressure Cuff/Weight Scale Pt has blood pressure cuff at home. Explained after first prenatal appt pt will check weekly and document in Babyscripts. Patient does have weight scale.  Anatomy US  Explained first scheduled US  will be around 19 weeks. Anatomy US  scheduled for 12/28/23 at 8am.  Is patient a  CenteringPregnancy candidate?  Declined Declined due to Group setting  Is patient a Mom+Baby Combined Care candidate?  Not a candidate.    Is patient a candidate for Babyscripts Optimization? Yes, patient declined.   First visit review I reviewed new OB appt with patient. Explained pt will be seen by Dr. Cresenzo at first visit 11/27/23 at 02:15pm. Discussed Natera genetic screening with patient. Desires Panorama. Routine prenatal labs, genetic testing, and pap smear needed at [facility].   Last Pap No results found for: DIAGPAP  Pt due for first pap smear.  Waddell LITTIE Burows, RN 11/02/2023  10:34 AM

## 2023-11-02 NOTE — Patient Instructions (Signed)

## 2023-11-27 ENCOUNTER — Ambulatory Visit (INDEPENDENT_AMBULATORY_CARE_PROVIDER_SITE_OTHER): Payer: Self-pay | Admitting: Family Medicine

## 2023-11-27 ENCOUNTER — Encounter: Payer: Self-pay | Admitting: General Practice

## 2023-11-27 ENCOUNTER — Other Ambulatory Visit (HOSPITAL_COMMUNITY)
Admission: RE | Admit: 2023-11-27 | Discharge: 2023-11-27 | Disposition: A | Discharge status: Home / Self Care | Source: Ambulatory Visit | Attending: Family Medicine | Admitting: Family Medicine

## 2023-11-27 ENCOUNTER — Other Ambulatory Visit: Payer: Self-pay

## 2023-11-27 VITALS — BP 111/72 | HR 82 | Wt 148.0 lb

## 2023-11-27 DIAGNOSIS — Z8759 Personal history of other complications of pregnancy, childbirth and the puerperium: Secondary | ICD-10-CM

## 2023-11-27 DIAGNOSIS — Z3482 Encounter for supervision of other normal pregnancy, second trimester: Secondary | ICD-10-CM

## 2023-11-27 DIAGNOSIS — Z3490 Encounter for supervision of normal pregnancy, unspecified, unspecified trimester: Secondary | ICD-10-CM | POA: Diagnosis present

## 2023-11-27 DIAGNOSIS — Z148 Genetic carrier of other disease: Secondary | ICD-10-CM | POA: Diagnosis not present

## 2023-11-27 DIAGNOSIS — Z3A14 14 weeks gestation of pregnancy: Secondary | ICD-10-CM | POA: Diagnosis not present

## 2023-11-27 MED ORDER — ASPIRIN 81 MG PO TBEC
81.0000 mg | DELAYED_RELEASE_TABLET | Freq: Every day | ORAL | 12 refills | Status: AC
Start: 2023-11-27 — End: ?

## 2023-11-28 LAB — CBC/D/PLT+RPR+RH+ABO+RUBIGG...
Antibody Screen: NEGATIVE
Basophils Absolute: 0 x10E3/uL (ref 0.0–0.2)
Basos: 1 %
EOS (ABSOLUTE): 0.1 x10E3/uL (ref 0.0–0.4)
Eos: 1 %
HCV Ab: NONREACTIVE
HIV Screen 4th Generation wRfx: NONREACTIVE
Hematocrit: 40.8 % (ref 34.0–46.6)
Hemoglobin: 13.8 g/dL (ref 11.1–15.9)
Hepatitis B Surface Ag: NEGATIVE
Immature Grans (Abs): 0 x10E3/uL (ref 0.0–0.1)
Immature Granulocytes: 0 %
Lymphocytes Absolute: 1.7 x10E3/uL (ref 0.7–3.1)
Lymphs: 30 %
MCH: 29.7 pg (ref 26.6–33.0)
MCHC: 33.8 g/dL (ref 31.5–35.7)
MCV: 88 fL (ref 79–97)
Monocytes Absolute: 0.3 x10E3/uL (ref 0.1–0.9)
Monocytes: 5 %
Neutrophils Absolute: 3.7 x10E3/uL (ref 1.4–7.0)
Neutrophils: 63 %
Platelets: 247 x10E3/uL (ref 150–450)
RBC: 4.64 x10E6/uL (ref 3.77–5.28)
RDW: 13.7 % (ref 11.7–15.4)
RPR Ser Ql: NONREACTIVE
Rh Factor: POSITIVE
Rubella Antibodies, IGG: 7.71 {index} (ref >0.99)
WBC: 5.8 x10E3/uL (ref 3.4–10.8)

## 2023-11-28 LAB — GC/CHLAMYDIA PROBE AMP (~~LOC~~) NOT AT ARMC
Chlamydia: NEGATIVE
Comment: NEGATIVE
Comment: NORMAL
Neisseria Gonorrhea: NEGATIVE

## 2023-11-28 LAB — HEMOGLOBIN A1C
Est. average glucose Bld gHb Est-mCnc: 111 mg/dL
Hgb A1c MFr Bld: 5.5 % (ref 4.8–5.6)

## 2023-11-28 LAB — HCV INTERPRETATION

## 2023-11-29 NOTE — Progress Notes (Signed)
 Subjective:   Haley Leblanc is a 23 y.o. G2P1001 at [redacted]w[redacted]d by LMP being seen today for her first obstetrical visit.  Her obstetrical history is significant for pregnancy induced hypertension. Patient does intend to breast feed. Pregnancy history fully reviewed.  Patient reports no bleeding, no contractions, no cramping, and no leaking.  HISTORY: OB History  Gravida Para Term Preterm AB Living  2 1 1  0 0 1  SAB IAB Ectopic Multiple Live Births  0 0 0 0 1    # Outcome Date GA Lbr Len/2nd Weight Sex Type Anes PTL Lv  2 Current           1 Term 07/09/22 [redacted]w[redacted]d 12:53 / 00:09 6 lb 13.7 oz (3.11 kg) F Vag-Spont EPI  LIV     Name: CESIAH, WESTLEY     Apgar1: 9  Apgar5: 9   Last pap smear was  never done  Past Medical History:  Diagnosis Date   Medical history non-contributory    Short cervix during pregnancy in second trimester 03/02/2022   CL 2.7 cm on anatomy scan     Past Surgical History:  Procedure Laterality Date   right wrist surgery     Family History  Problem Relation Age of Onset   Healthy Mother    Healthy Father    Asthma Neg Hx    Diabetes Neg Hx    Heart disease Neg Hx    Hypertension Neg Hx    Stroke Neg Hx    Social History   Tobacco Use   Smoking status: Never    Passive exposure: Yes  Vaping Use   Vaping status: Never Used  Substance Use Topics   Alcohol use: Not Currently   Drug use: Not Currently   No Known Allergies Current Outpatient Medications on File Prior to Visit  Medication Sig Dispense Refill   Prenatal MV & Min w/FA-DHA (PRENATAL GUMMIES PO) Take 2 tablets by mouth daily.     No current facility-administered medications on file prior to visit.     Exam   Vitals:   11/27/23 1437  BP: 111/72  Pulse: 82  Weight: 148 lb (67.1 kg)   Fetal Heart Rate (bpm): 154  System: General: well-developed, well-nourished female in no acute distress   Skin: normal coloration and turgor, no rashes   Neurologic: oriented, normal,  negative, normal mood   Extremities: normal strength, tone, and muscle mass, ROM of all joints is normal   HEENT PERRLA, extraocular movement intact and sclera clear, anicteric   Mouth/Teeth mucous membranes moist, pharynx normal without lesions and dental hygiene good   Neck supple and no masses   Cardiovascular: regular rate and rhythm   Respiratory:  no respiratory distress, normal breath sounds   Abdomen: soft, non-tender; bowel sounds normal; no masses,  no organomegaly     Assessment:   Pregnancy: G2P1001 Patient Active Problem List   Diagnosis Date Noted   Encounter for supervision of low-risk pregnancy, antepartum 11/02/2023   History of gestational hypertension 08/11/2022   Carrier of spinal muscular atrophy 02/23/2022     Plan:  1. Encounter for supervision of low-risk pregnancy, antepartum (Primary) FHR BP appropriate today - HgB A1c - GC/Chlamydia probe amp ([place])not at [facility] - Culture, OB Urine - CBC/D/Plt+RPR+Rh+ABO+RubIgG...  2. History of gestational hypertension Recommended ASA initiation immediately  3. Carrier of spinal muscular atrophy  4. [redacted] weeks gestation of pregnancy  5. Encounter for supervision of other normal pregnancy in  second trimester - PANORAMA PRENATAL TEST   Initial labs drawn. Continue prenatal vitamins. Genetic Screening discussed, NIPS: ordered. Ultrasound discussed; fetal anatomic survey: Scheduled. Problem list reviewed and updated. The nature of [facility] with multiple MDs and other Advanced Practice Providers was explained to patient; also emphasized that residents, students are part of our team. Routine obstetric precautions reviewed. No follow-ups on file.

## 2023-11-30 ENCOUNTER — Ambulatory Visit: Payer: Self-pay | Admitting: Family Medicine

## 2023-11-30 DIAGNOSIS — Z3490 Encounter for supervision of normal pregnancy, unspecified, unspecified trimester: Secondary | ICD-10-CM

## 2023-11-30 LAB — CULTURE, OB URINE

## 2023-11-30 LAB — URINE CULTURE, OB REFLEX

## 2023-12-03 LAB — PANORAMA PRENATAL TEST FULL PANEL:PANORAMA TEST PLUS 5 ADDITIONAL MICRODELETIONS: FETAL FRACTION: 4.1

## 2023-12-06 ENCOUNTER — Encounter: Payer: Self-pay | Admitting: Family Medicine

## 2023-12-23 NOTE — Progress Notes (Signed)
 PRENATAL VISIT NOTE  Subjective:  Haley Leblanc is a 23 y.o. G2P1001 at [redacted]w[redacted]d being seen today for ongoing prenatal care.  She is currently monitored for the following issues for this low-risk pregnancy and has Carrier of spinal muscular atrophy; History of gestational hypertension; and Encounter for supervision of low-risk pregnancy, antepartum on their problem list.  Patient reports {sx:14538}.   .  .   . Denies leaking of fluid.   The following portions of the patient's history were reviewed and updated as appropriate: allergies, current medications, past family history, past medical history, past social history, past surgical history and problem list.   Objective:   There were no vitals filed for this visit.  Fetal Status:           General: Alert, oriented and cooperative. Patient is in no acute distress.  Skin: Skin is warm and dry. No rash noted.   Cardiovascular: Normal heart rate noted  Respiratory: Normal respiratory effort, no problems with respiration noted  Abdomen: Soft, gravid, appropriate for gestational age.        Pelvic: Cervical exam deferred        Extremities: Normal range of motion.     Mental Status: Normal mood and affect. Normal behavior. Normal judgment and thought content.      06/15/2022    2:26 PM 04/18/2022    9:37 AM 02/11/2022    9:44 AM  Depression screen PHQ 2/9  Decreased Interest 0 0 0  Down, Depressed, Hopeless 0 0 0  PHQ - 2 Score 0 0 0  Altered sleeping 0 0 0  Tired, decreased energy 0 0 0  Change in appetite 0 0 0  Feeling bad or failure about yourself  0 0 0  Trouble concentrating 0 0 0  Moving slowly or fidgety/restless 0 0 0  Suicidal thoughts 0 0 0  PHQ-9 Score 0  0  0   Difficult doing work/chores Not difficult at all Not difficult at all Not difficult at all     Data saved with a previous flowsheet row definition        06/15/2022    2:26 PM 04/18/2022    9:38 AM 02/11/2022    9:45 AM  GAD 7 : Generalized Anxiety Score   Nervous, Anxious, on Edge 0 0 0  Control/stop worrying 0 0 0  Worry too much - different things 0 0 0  Trouble relaxing 0 0 0  Restless 0 0 0  Easily annoyed or irritable 0 0 1  Afraid - awful might happen 0 0 0  Total GAD 7 Score 0 0 1  Anxiety Difficulty Not difficult at all Not difficult at all     Assessment and Plan:  Pregnancy: G2P1001 at [redacted]w[redacted]d 1. Encounter for supervision of low-risk pregnancy, antepartum (Primary) -   2. [redacted] weeks gestation of pregnancy - Routine PNC, anticipatory guidance.    3. History of gestational hypertension - 81mg  ASA daily.   4. Carrier of spinal muscular atrophy - MFM appointment 12/28/23  Preterm labor symptoms and general obstetric precautions including but not limited to vaginal bleeding, contractions, leaking of fluid and fetal movement were reviewed in detail with the patient. Please refer to After Visit Summary for other counseling recommendations.   Return in about 4 weeks (around 01/23/2024) for LOB.  Future Appointments  Date Time Provider Department Center  12/26/2023  9:35 AM Regino Camie DELENA EDDY [facility]  12/28/2023  8:00 AM [facility] PROVIDER 1 [facility]  12/28/2023  8:30 AM [facility] US5 [facility]    Camie DELENA Rote, CNM

## 2023-12-26 ENCOUNTER — Ambulatory Visit: Admitting: Certified Nurse Midwife

## 2023-12-26 ENCOUNTER — Other Ambulatory Visit: Payer: Self-pay

## 2023-12-26 VITALS — BP 117/68 | HR 83 | Wt 153.3 lb

## 2023-12-26 DIAGNOSIS — Z3A19 19 weeks gestation of pregnancy: Secondary | ICD-10-CM

## 2023-12-26 DIAGNOSIS — Z8759 Personal history of other complications of pregnancy, childbirth and the puerperium: Secondary | ICD-10-CM

## 2023-12-26 DIAGNOSIS — Z148 Genetic carrier of other disease: Secondary | ICD-10-CM

## 2023-12-26 DIAGNOSIS — Z3490 Encounter for supervision of normal pregnancy, unspecified, unspecified trimester: Secondary | ICD-10-CM

## 2023-12-28 ENCOUNTER — Other Ambulatory Visit: Attending: Obstetrics and Gynecology

## 2023-12-28 ENCOUNTER — Other Ambulatory Visit: Payer: Self-pay | Admitting: Family Medicine

## 2023-12-28 ENCOUNTER — Other Ambulatory Visit: Payer: Self-pay | Admitting: *Deleted

## 2023-12-28 ENCOUNTER — Ambulatory Visit: Admitting: Obstetrics

## 2023-12-28 VITALS — BP 108/51 | HR 72

## 2023-12-28 DIAGNOSIS — Z3490 Encounter for supervision of normal pregnancy, unspecified, unspecified trimester: Secondary | ICD-10-CM | POA: Diagnosis not present

## 2023-12-28 DIAGNOSIS — Z3A19 19 weeks gestation of pregnancy: Secondary | ICD-10-CM | POA: Insufficient documentation

## 2023-12-28 DIAGNOSIS — Z362 Encounter for other antenatal screening follow-up: Secondary | ICD-10-CM

## 2023-12-28 DIAGNOSIS — O09892 Supervision of other high risk pregnancies, second trimester: Secondary | ICD-10-CM | POA: Insufficient documentation

## 2023-12-28 DIAGNOSIS — O26879 Cervical shortening, unspecified trimester: Secondary | ICD-10-CM

## 2023-12-28 DIAGNOSIS — Z148 Genetic carrier of other disease: Secondary | ICD-10-CM | POA: Insufficient documentation

## 2023-12-28 DIAGNOSIS — Z8759 Personal history of other complications of pregnancy, childbirth and the puerperium: Secondary | ICD-10-CM | POA: Insufficient documentation

## 2023-12-28 NOTE — Progress Notes (Unsigned)
 MFM Consult Note  Haley Leblanc is currently at [redacted]w[redacted]d. She was seen today for a detailed fetal anatomy scan due to a short interval pregnancy.   She developed postpartum hypertension following her last delivery in 2024.  She denies any problems in her current pregnancy.    She had a cell free DNA test earlier in her pregnancy which indicated a low risk for trisomy 39, 62, and 13. A female fetus is predicted.   Sonographic findings Single intrauterine pregnancy at 19w 2d. Fetal cardiac activity:  Observed and appears normal. Presentation: Variable. The views of the fetal anatomy were limited today due to the fetal position.  What was visualized today appeared within normal limits. Fetal biometry shows the estimated fetal weight of 0 lb 10 oz, 277 grams (38%). Amniotic fluid: Within normal limits.  MVP: 4.35 cm. Placenta: Anterior. Adnexa: No abnormality visualized. Cervical length: 2.8 cm.  The patient was informed that anomalies may be missed due to technical limitations. If the fetus is in a suboptimal position or maternal habitus is increased, visualization of the fetus in the maternal uterus may be impaired.  During the performance of her abdominal scan, a possible shortened cervix was noted.  Therefore, a transvaginal ultrasound was performed today showing a cervical length of 2.8 cm long.    The increased risk of a preterm birth due to a shortened cervix was discussed.    She reports that she is not concerned regarding the shortened cervix as she had a similar cervical length during her last pregnancy in 2024 and went on to deliver at 39 weeks and 5 days.    She was offered and declined treatment with vaginal progesterone.    She would prefer to be followed with another cervical length measurement in 2 weeks.  Preterm labor precautions were reviewed.  The patient stated that all of her questions were answered.   A total of 25 minutes was spent counseling and coordinating the  care for this patient.  Greater than 50% of the time was spent in direct face-to-face contact.

## 2024-01-08 ENCOUNTER — Other Ambulatory Visit: Payer: Self-pay

## 2024-01-08 ENCOUNTER — Inpatient Hospital Stay (HOSPITAL_COMMUNITY)

## 2024-01-08 ENCOUNTER — Inpatient Hospital Stay (HOSPITAL_COMMUNITY)
Admission: AD | Admit: 2024-01-08 | Discharge: 2024-01-08 | Disposition: A | Discharge status: Home / Self Care | Source: Home / Self Care | Attending: Obstetrics & Gynecology | Admitting: Obstetrics & Gynecology

## 2024-01-08 DIAGNOSIS — Z369 Encounter for antenatal screening, unspecified: Secondary | ICD-10-CM | POA: Diagnosis not present

## 2024-01-08 DIAGNOSIS — Z3A20 20 weeks gestation of pregnancy: Secondary | ICD-10-CM | POA: Diagnosis not present

## 2024-01-08 DIAGNOSIS — W19XXXA Unspecified fall, initial encounter: Secondary | ICD-10-CM

## 2024-01-08 DIAGNOSIS — Z148 Genetic carrier of other disease: Secondary | ICD-10-CM

## 2024-01-08 DIAGNOSIS — W010XXA Fall on same level from slipping, tripping and stumbling without subsequent striking against object, initial encounter: Secondary | ICD-10-CM | POA: Diagnosis not present

## 2024-01-08 DIAGNOSIS — O9A212 Injury, poisoning and certain other consequences of external causes complicating pregnancy, second trimester: Secondary | ICD-10-CM

## 2024-01-08 DIAGNOSIS — O26892 Other specified pregnancy related conditions, second trimester: Secondary | ICD-10-CM

## 2024-01-08 DIAGNOSIS — O26872 Cervical shortening, second trimester: Secondary | ICD-10-CM

## 2024-01-08 DIAGNOSIS — O09292 Supervision of pregnancy with other poor reproductive or obstetric history, second trimester: Secondary | ICD-10-CM | POA: Diagnosis not present

## 2024-01-08 DIAGNOSIS — Z3492 Encounter for supervision of normal pregnancy, unspecified, second trimester: Secondary | ICD-10-CM

## 2024-01-08 LAB — URINALYSIS, ROUTINE W REFLEX MICROSCOPIC
Bilirubin Urine: NEGATIVE
Glucose, UA: NEGATIVE mg/dL
Hgb urine dipstick: NEGATIVE
Ketones, ur: NEGATIVE mg/dL
Nitrite: NEGATIVE
Protein, ur: NEGATIVE mg/dL
Specific Gravity, Urine: 1.023 (ref 1.005–1.030)
pH: 5 (ref 5.0–8.0)

## 2024-01-08 MED ORDER — CYCLOBENZAPRINE HCL 5 MG PO TABS: 5.0000 mg | ORAL_TABLET | Freq: Three times a day (TID) | ORAL | 0 refills | Status: AC | PRN

## 2024-01-08 NOTE — Discharge Instructions (Signed)
 Haley Leblanc,  It was a pleasure seeing you in MAU. Everything is reassuring with your baby. As I mentioned, the concern after a fall is with your placenta. No concerns were noted on US , however, if you start to notice any bleeding, increased abdominal pain and tightening, please come back to MAU.   I'm prescribing a few tablets of a medication that can help with pain in pregnancy. It is safe, but can make you tired so don't drive after taking it until you know how it effects you.  Thank you for trusting us  to care for you, Haley Leblanc, Midwife

## 2024-01-08 NOTE — MAU Provider Note (Signed)
 None     S [name] is a 23 y.o. G2P1001 pregnant female at [redacted]w[redacted]d who presents to MAU today with complaint of s/p fall at work. Reports she slipped on some water and fell forward, hitting her abdomen. She denies LOF or VB. She has not perceived fetal movement since fall. She declines pain medication here, but accepts medication in case for home use. [name]   Receives care at [facility]. Prenatal records reviewed.  Pertinent items noted in HPI and remainder of comprehensive ROS otherwise negative.   O BP (!) 116/56 (BP Location: Right Arm)   Pulse 93   Temp 98.8 F (37.1 C)   Resp 20   LMP 07/15/2023 (Exact Date)   SpO2 100%  Physical Exam Vitals reviewed.  Constitutional:      General: She is not in acute distress.    Appearance: Normal appearance. She is not ill-appearing, toxic-appearing or diaphoretic.  HENT:     Head: Normocephalic.  Cardiovascular:     Rate and Rhythm: Normal rate.     Pulses: Normal pulses.  Pulmonary:     Effort: Pulmonary effort is normal.  Skin:    General: Skin is warm and dry.     Capillary Refill: Capillary refill takes less than 2 seconds.  Neurological:     General: No focal deficit present.     Mental Status: She is alert and oriented to person, place, and time.  Psychiatric:        Mood and Affect: Mood normal.        Behavior: Behavior normal.        Thought Content: Thought content normal.        Judgment: Judgment normal.      MDM:  Moderate  MAU Course:  A Fall, initial encounter - Plan: Discharge patient  [redacted] weeks gestation of pregnancy  Presence of fetal heart sounds in second trimester  Medical screening exam complete  FHT obtained. US  WNL.   P Discharge from MAU in stable condition with strict precautions Follow up at [facility] as scheduled for ongoing prenatal care May continue to take Tylenol  1000mg  q6h PRN for pain. Addition of Flexeril  5mg  q6h PRN pain.  Return precautions advised.   Allergies as of 01/08/2024    No Known Allergies      Medication List     TAKE these medications    aspirin  EC 81 MG tablet Take 1 tablet (81 mg total) by mouth daily. Swallow whole.   cyclobenzaprine  5 MG tablet Commonly known as: FLEXERIL  Take 1 tablet (5 mg total) by mouth 3 (three) times daily as needed.   PRENATAL GUMMIES PO Take 2 tablets by mouth daily.        Camie Rote, MSN, CNM 01/08/2024 6:17 PM  Certified Nurse Midwife, [facility]

## 2024-01-08 NOTE — MAU Note (Signed)
.  Haley Leblanc is a 23 y.o. at [redacted]w[redacted]d here in MAU reporting: patient at work today and slipped on a water spill and fell front wards on the pan.   Denies vaginal Denies LOF not fetal movement since the falla  Onset of complaint: today  Pain score: cramping 3/10 There were no vitals filed for this visit.   FHT:160 Lab orders placed from triage:   ua

## 2024-01-30 ENCOUNTER — Ambulatory Visit: Admitting: Obstetrics and Gynecology

## 2024-01-30 ENCOUNTER — Other Ambulatory Visit: Payer: Self-pay

## 2024-01-30 ENCOUNTER — Encounter: Payer: Self-pay | Admitting: Obstetrics and Gynecology

## 2024-01-30 VITALS — BP 109/69 | HR 89 | Wt 158.3 lb

## 2024-01-30 DIAGNOSIS — Z3A24 24 weeks gestation of pregnancy: Secondary | ICD-10-CM | POA: Diagnosis not present

## 2024-01-30 DIAGNOSIS — O26872 Cervical shortening, second trimester: Secondary | ICD-10-CM | POA: Diagnosis not present

## 2024-01-30 DIAGNOSIS — Z8759 Personal history of other complications of pregnancy, childbirth and the puerperium: Secondary | ICD-10-CM | POA: Diagnosis not present

## 2024-01-30 NOTE — Progress Notes (Signed)
 "  PRENATAL VISIT NOTE  Subjective:  Haley Leblanc is a 24 y.o. G2P1001 at [redacted]w[redacted]d being seen today for ongoing prenatal care.  She is currently monitored for the following issues for this low-risk pregnancy and has Carrier of spinal muscular atrophy; Borderline short cervical length during pregnancy in second trimester; History of gestational hypertension; and Encounter for supervision of low-risk pregnancy, antepartum on their problem list.  Patient reports no complaints.  Contractions: Not present. Vag. Bleeding: None.  Movement: Present. Denies leaking of fluid.   The following portions of the patient's history were reviewed and updated as appropriate: allergies, current medications, past family history, past medical history, past social history, past surgical history and problem list.   Objective:   Vitals:   01/30/24 0956  BP: 109/69  Pulse: 89  Weight: 158 lb 4.8 oz (71.8 kg)    Fetal Status:  Fetal Heart Rate (bpm): 151   Movement: Present    General: Alert, oriented and cooperative. Patient is in no acute distress.  Skin: Skin is warm and dry. No rash noted.   Cardiovascular: Normal heart rate noted  Respiratory: Normal respiratory effort, no problems with respiration noted  Abdomen: Soft, gravid, appropriate for gestational age.  Pain/Pressure: Absent     Pelvic: Cervical exam deferred        Extremities: Normal range of motion.  Edema: None  Mental Status: Normal mood and affect. Normal behavior. Normal judgment and thought content.      06/15/2022    2:26 PM 04/18/2022    9:37 AM 02/11/2022    9:44 AM  Depression screen PHQ 2/9  Decreased Interest 0 0 0  Down, Depressed, Hopeless 0 0 0  PHQ - 2 Score 0 0 0  Altered sleeping 0 0 0  Tired, decreased energy 0 0 0  Change in appetite 0 0 0  Feeling bad or failure about yourself  0 0 0  Trouble concentrating 0 0 0  Moving slowly or fidgety/restless 0 0 0  Suicidal thoughts 0 0 0  PHQ-9 Score 0  0  0   Difficult  doing work/chores Not difficult at all Not difficult at all Not difficult at all     Data saved with a previous flowsheet row definition        06/15/2022    2:26 PM 04/18/2022    9:38 AM 02/11/2022    9:45 AM  GAD 7 : Generalized Anxiety Score  Nervous, Anxious, on Edge 0 0 0  Control/stop worrying 0 0 0  Worry too much - different things 0 0 0  Trouble relaxing 0 0 0  Restless 0 0 0  Easily annoyed or irritable 0 0 1  Afraid - awful might happen 0 0 0  Total GAD 7 Score 0 0 1  Anxiety Difficulty Not difficult at all Not difficult at all     Assessment and Plan:  Pregnancy: G2P1001 at [redacted]w[redacted]d 1. [redacted] weeks gestation of pregnancy (Primary) 28wk labs next visit  2. History of gestational hypertension Continue low dose asa  3. Short cervical length during pregnancy in second trimester Follow up rpt scan later this week 12/4 TVUS anatomy u/s @ 19/2: 2.8cm 12/15 MAU s/p fall TAUS: 3.5cm  Preterm labor symptoms and general obstetric precautions including but not limited to vaginal bleeding, contractions, leaking of fluid and fetal movement were reviewed in detail with the patient. Please refer to After Visit Summary for other counseling recommendations.   Return in about 3 weeks (  around 02/20/2024) for in person, low risk ob, md or app, fasting 2hr GTT.  Future Appointments  Date Time Provider Department Center  02/01/2024  3:15 PM [facility] PROVIDER 1 [facility]  02/01/2024  3:30 PM [facility] US2 [facility]    Bebe Furry, MD  "

## 2024-02-01 ENCOUNTER — Ambulatory Visit: Attending: Obstetrics

## 2024-02-01 ENCOUNTER — Other Ambulatory Visit: Payer: Self-pay | Admitting: Obstetrics

## 2024-02-01 ENCOUNTER — Ambulatory Visit (HOSPITAL_BASED_OUTPATIENT_CLINIC_OR_DEPARTMENT_OTHER): Admitting: Maternal & Fetal Medicine

## 2024-02-01 VITALS — BP 96/78 | HR 84

## 2024-02-01 DIAGNOSIS — Z3A24 24 weeks gestation of pregnancy: Secondary | ICD-10-CM | POA: Diagnosis present

## 2024-02-01 DIAGNOSIS — O26872 Cervical shortening, second trimester: Secondary | ICD-10-CM

## 2024-02-01 DIAGNOSIS — O26879 Cervical shortening, unspecified trimester: Secondary | ICD-10-CM | POA: Diagnosis present

## 2024-02-01 DIAGNOSIS — Z8759 Personal history of other complications of pregnancy, childbirth and the puerperium: Secondary | ICD-10-CM

## 2024-02-01 DIAGNOSIS — O365930 Maternal care for other known or suspected poor fetal growth, third trimester, not applicable or unspecified: Secondary | ICD-10-CM | POA: Insufficient documentation

## 2024-02-01 DIAGNOSIS — Z362 Encounter for other antenatal screening follow-up: Secondary | ICD-10-CM | POA: Insufficient documentation

## 2024-02-01 DIAGNOSIS — Z148 Genetic carrier of other disease: Secondary | ICD-10-CM | POA: Diagnosis not present

## 2024-02-02 NOTE — Progress Notes (Signed)
 "  Patient information  Patient Name: Haley Leblanc  Patient MRN:   [id number]  Referring practice: MFM Referring Provider: [facility])  Problem List   Patient Active Problem List   Diagnosis Date Noted   Poor fetal growth affecting management of mother in third trimester 02/01/2024   Encounter for supervision of low-risk pregnancy, antepartum 11/02/2023   History of gestational hypertension 08/11/2022   Borderline short cervical length during pregnancy in second trimester 03/02/2022   Carrier of spinal muscular atrophy 02/23/2022   Maternal Fetal medicine Consult  Haley Leblanc is a 24 y.o. G2P1001 at 100w3d here for ultrasound and consultation. Haley Leblanc is doing well today with no acute concerns. Today we focused on the following:   The patient presents today for completion of the fetal anatomy ultrasound. The anatomic survey appears complete and within normal limits. Estimated fetal weight is at the 10th percentile. Umbilical artery Doppler studies are normal.  We discussed the possibility of early or impending fetal growth restriction; however, at this time, fetal growth remains technically within the normal range. We reviewed that fetal growth restriction is defined as an estimated fetal weight less than the 10th percentile overall or an abdominal circumference less than the 10th percentile. The patient reports good fetal movement.  Given the reassuring Doppler findings, normal anatomy, and maternal body habitus, this growth pattern is most consistent with constitutional growth. Interval surveillance is recommended to assess for stability of growth.  Recommendations -Return in approximately three weeks for a repeat growth ultrasound to assess interval fetal growth. -Continue routine prenatal care with primary obstetric provider. -Monitor fetal movement and report any decrease in perceived movement. -Continue routine surveillance, with  reassurance that current findings are most consistent with constitutional growth given normal Dopplers and anatomy.  The patient had time to ask questions that were answered to her satisfaction.  She verbalized understanding and agrees to proceed with the plan above.   I spent 30 minutes reviewing the patients chart, including labs and images as well as counseling the patient about her medical conditions. Greater than 50% of the time was spent in direct face-to-face patient counseling.  [name]  MFM, [place]   02/02/2024  12:25 PM   Review of Systems: A review of systems was performed and was negative except per HPI   Vitals and Physical Exam    02/01/2024    3:19 PM 01/30/2024    9:56 AM 01/08/2024    2:17 PM  Vitals with BMI  Weight  158 lbs 5 oz   Systolic 96 109 116  Diastolic 78 69 56  Pulse 84 89 93    Sitting comfortably on the sonogram table Nonlabored breathing Normal rate and rhythm Abdomen is nontender  Past pregnancies OB History  Gravida Para Term Preterm AB Living  2 1 1  0 0 1  SAB IAB Ectopic Multiple Live Births  0 0 0 0 1    # Outcome Date GA Lbr Len/2nd Weight Sex Type Anes PTL Lv  2 Current           1 Term 07/09/22 [redacted]w[redacted]d 12:53 / 00:09 6 lb 13.7 oz (3.11 kg) F Vag-Spont EPI  LIV     Future Appointments  Date Time Provider Department Center  02/22/2024  8:40 AM [facility]  02/22/2024  9:35 AM Regino Camie DELENA EDDY [facility]  02/22/2024 11:15 AM [facility] PROVIDER 1 [facility]  02/22/2024 11:30  AM [facility] US4 [facility]      "

## 2024-02-07 ENCOUNTER — Encounter: Payer: Self-pay | Admitting: Certified Nurse Midwife

## 2024-02-18 ENCOUNTER — Encounter: Payer: Self-pay | Admitting: Certified Nurse Midwife

## 2024-02-20 ENCOUNTER — Other Ambulatory Visit: Payer: Self-pay

## 2024-02-20 DIAGNOSIS — O219 Vomiting of pregnancy, unspecified: Secondary | ICD-10-CM

## 2024-02-20 MED ORDER — PROMETHAZINE HCL 25 MG PO TABS: 25.0000 mg | ORAL_TABLET | Freq: Four times a day (QID) | ORAL | 0 refills | Status: AC | PRN

## 2024-02-21 NOTE — Progress Notes (Unsigned)
 "  PRENATAL VISIT NOTE  Subjective:  Haley Leblanc is a 24 y.o. G2P1001 at [redacted]w[redacted]d being seen today for ongoing prenatal care.  She is currently monitored for the following issues for this high-risk pregnancy and has Carrier of spinal muscular atrophy; Borderline short cervical length during pregnancy in second trimester; History of gestational hypertension; Encounter for supervision of low-risk pregnancy, antepartum; and Poor fetal growth affecting management of mother in third trimester on their problem list.  Patient reports {sx:14538}.   .  .   . Denies leaking of fluid.   The following portions of the patient's history were reviewed and updated as appropriate: allergies, current medications, past family history, past medical history, past social history, past surgical history and problem list.   Objective:   There were no vitals filed for this visit.  Fetal Status:           General: Alert, oriented and cooperative. Patient is in no acute distress.  Skin: Skin is warm and dry. No rash noted.   Cardiovascular: Normal heart rate noted  Respiratory: Normal respiratory effort, no problems with respiration noted  Abdomen: Soft, gravid, appropriate for gestational age.        Pelvic: Cervical exam deferred        Extremities: Normal range of motion.     Mental Status: Normal mood and affect. Normal behavior. Normal judgment and thought content.      06/15/2022    2:26 PM 04/18/2022    9:37 AM 02/11/2022    9:44 AM  Depression screen PHQ 2/9  Decreased Interest 0 0 0  Down, Depressed, Hopeless 0 0 0  PHQ - 2 Score 0 0 0  Altered sleeping 0 0 0  Tired, decreased energy 0 0 0  Change in appetite 0 0 0  Feeling bad or failure about yourself  0 0 0  Trouble concentrating 0 0 0  Moving slowly or fidgety/restless 0 0 0  Suicidal thoughts 0 0 0  PHQ-9 Score 0  0  0   Difficult doing work/chores Not difficult at all Not difficult at all Not difficult at all     Data saved with a  previous flowsheet row definition        06/15/2022    2:26 PM 04/18/2022    9:38 AM 02/11/2022    9:45 AM  GAD 7 : Generalized Anxiety Score  Nervous, Anxious, on Edge 0  0  0   Control/stop worrying 0  0  0   Worry too much - different things 0  0  0   Trouble relaxing 0  0  0   Restless 0  0  0   Easily annoyed or irritable 0  0  1   Afraid - awful might happen 0  0  0   Total GAD 7 Score 0 0 1  Anxiety Difficulty Not difficult at all Not difficult at all      Data saved with a previous flowsheet row definition    Assessment and Plan:  Pregnancy: G2P1001 at [redacted]w[redacted]d 1. Encounter for supervision of other normal pregnancy in second trimester (Primary) - Doing well, feeling regular and vigorous fetal movement   2. [redacted] weeks gestation of pregnancy - Routine PNC, anticipatory guidance.  - 3T labs.   3. History of gestational hypertension - 81 mg ASA previously prescribed.   4. Nausea and vomiting in pregnancy ***  5. Carrier of spinal muscular atrophy ***  Preterm labor symptoms and  general obstetric precautions including but not limited to vaginal bleeding, contractions, leaking of fluid and fetal movement were reviewed in detail with the patient. Please refer to After Visit Summary for other counseling recommendations.   No follow-ups on file.  Future Appointments  Date Time Provider Department Center  02/22/2024  8:40 AM [facility]  02/22/2024  9:35 AM Regino Camie DELENA EDDY [facility]  02/22/2024 11:15 AM [facility] PROVIDER 1 [facility]  02/22/2024 11:30 AM [facility] US4 [facility]    Camie DELENA Regino, CNM  "

## 2024-02-22 ENCOUNTER — Ambulatory Visit: Admitting: Obstetrics and Gynecology

## 2024-02-22 ENCOUNTER — Other Ambulatory Visit: Payer: Self-pay

## 2024-02-22 ENCOUNTER — Ambulatory Visit

## 2024-02-22 ENCOUNTER — Ambulatory Visit: Payer: Self-pay | Admitting: Certified Nurse Midwife

## 2024-02-22 VITALS — BP 136/52 | HR 69

## 2024-02-22 VITALS — BP 123/55 | HR 76 | Wt 162.0 lb

## 2024-02-22 DIAGNOSIS — O219 Vomiting of pregnancy, unspecified: Secondary | ICD-10-CM

## 2024-02-22 DIAGNOSIS — Z148 Genetic carrier of other disease: Secondary | ICD-10-CM

## 2024-02-22 DIAGNOSIS — O09292 Supervision of pregnancy with other poor reproductive or obstetric history, second trimester: Secondary | ICD-10-CM | POA: Diagnosis not present

## 2024-02-22 DIAGNOSIS — O365930 Maternal care for other known or suspected poor fetal growth, third trimester, not applicable or unspecified: Secondary | ICD-10-CM | POA: Diagnosis present

## 2024-02-22 DIAGNOSIS — Z141 Cystic fibrosis carrier: Secondary | ICD-10-CM

## 2024-02-22 DIAGNOSIS — Z8759 Personal history of other complications of pregnancy, childbirth and the puerperium: Secondary | ICD-10-CM

## 2024-02-22 DIAGNOSIS — O26873 Cervical shortening, third trimester: Secondary | ICD-10-CM

## 2024-02-22 DIAGNOSIS — Z3A27 27 weeks gestation of pregnancy: Secondary | ICD-10-CM | POA: Diagnosis present

## 2024-02-22 DIAGNOSIS — Z3A24 24 weeks gestation of pregnancy: Secondary | ICD-10-CM

## 2024-02-22 DIAGNOSIS — Z3482 Encounter for supervision of other normal pregnancy, second trimester: Secondary | ICD-10-CM

## 2024-02-22 NOTE — Progress Notes (Signed)
 After review, MFM consult with provider is not indicated for today  Arna Ranks, MD 02/22/2024 1:23 PM  [facility]

## 2024-02-23 ENCOUNTER — Ambulatory Visit: Payer: Self-pay | Admitting: Certified Nurse Midwife

## 2024-02-23 LAB — CBC
Hematocrit: 35.4 % (ref 34.0–46.6)
Hemoglobin: 11.9 g/dL (ref 11.1–15.9)
MCH: 30.4 pg (ref 26.6–33.0)
MCHC: 33.6 g/dL (ref 31.5–35.7)
MCV: 90 fL (ref 79–97)
Platelets: 191 10*3/uL (ref 150–450)
RBC: 3.92 x10E6/uL (ref 3.77–5.28)
RDW: 13.3 % (ref 11.7–15.4)
WBC: 6.6 10*3/uL (ref 3.4–10.8)

## 2024-02-23 LAB — GLUCOSE TOLERANCE, 2 HOURS W/ 1HR
Glucose, 1 hour: 123 mg/dL (ref 70–179)
Glucose, 2 hour: 66 mg/dL — ABNORMAL LOW (ref 70–152)
Glucose, Fasting: 83 mg/dL (ref 70–91)

## 2024-02-23 LAB — VITAMIN D 25 HYDROXY (VIT D DEFICIENCY, FRACTURES): Vit D, 25-Hydroxy: 17.5 ng/mL — ABNORMAL LOW (ref 30.0–100.0)

## 2024-02-23 LAB — SYPHILIS: RPR W/REFLEX TO RPR TITER AND TREPONEMAL ANTIBODIES, TRADITIONAL SCREENING AND DIAGNOSIS ALGORITHM: RPR Ser Ql: NONREACTIVE

## 2024-02-23 LAB — HIV ANTIBODY (ROUTINE TESTING W REFLEX): HIV Screen 4th Generation wRfx: NONREACTIVE

## 2024-03-11 ENCOUNTER — Encounter: Payer: Self-pay | Admitting: Obstetrics and Gynecology

## 2024-04-04 ENCOUNTER — Ambulatory Visit

## 2024-05-12 IMAGING — DX DG FINGER RING 2+V*L*
3 series · 3 of 3 positions shown · non-contrast
Comparison: None Available.

CLINICAL DATA: left ring finger laceration

EXAM:
LEFT RING FINGER 2+V

[finger obl]
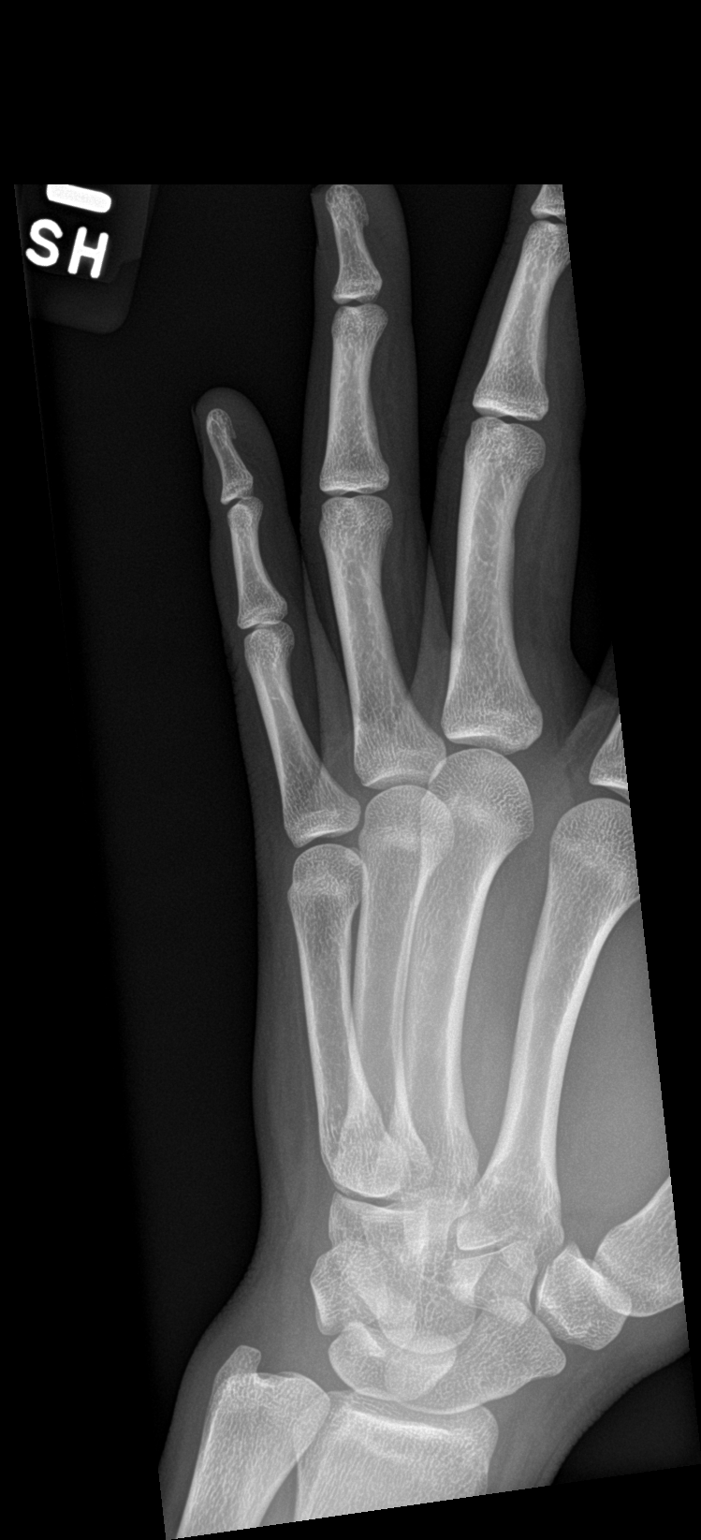

[finger lat]
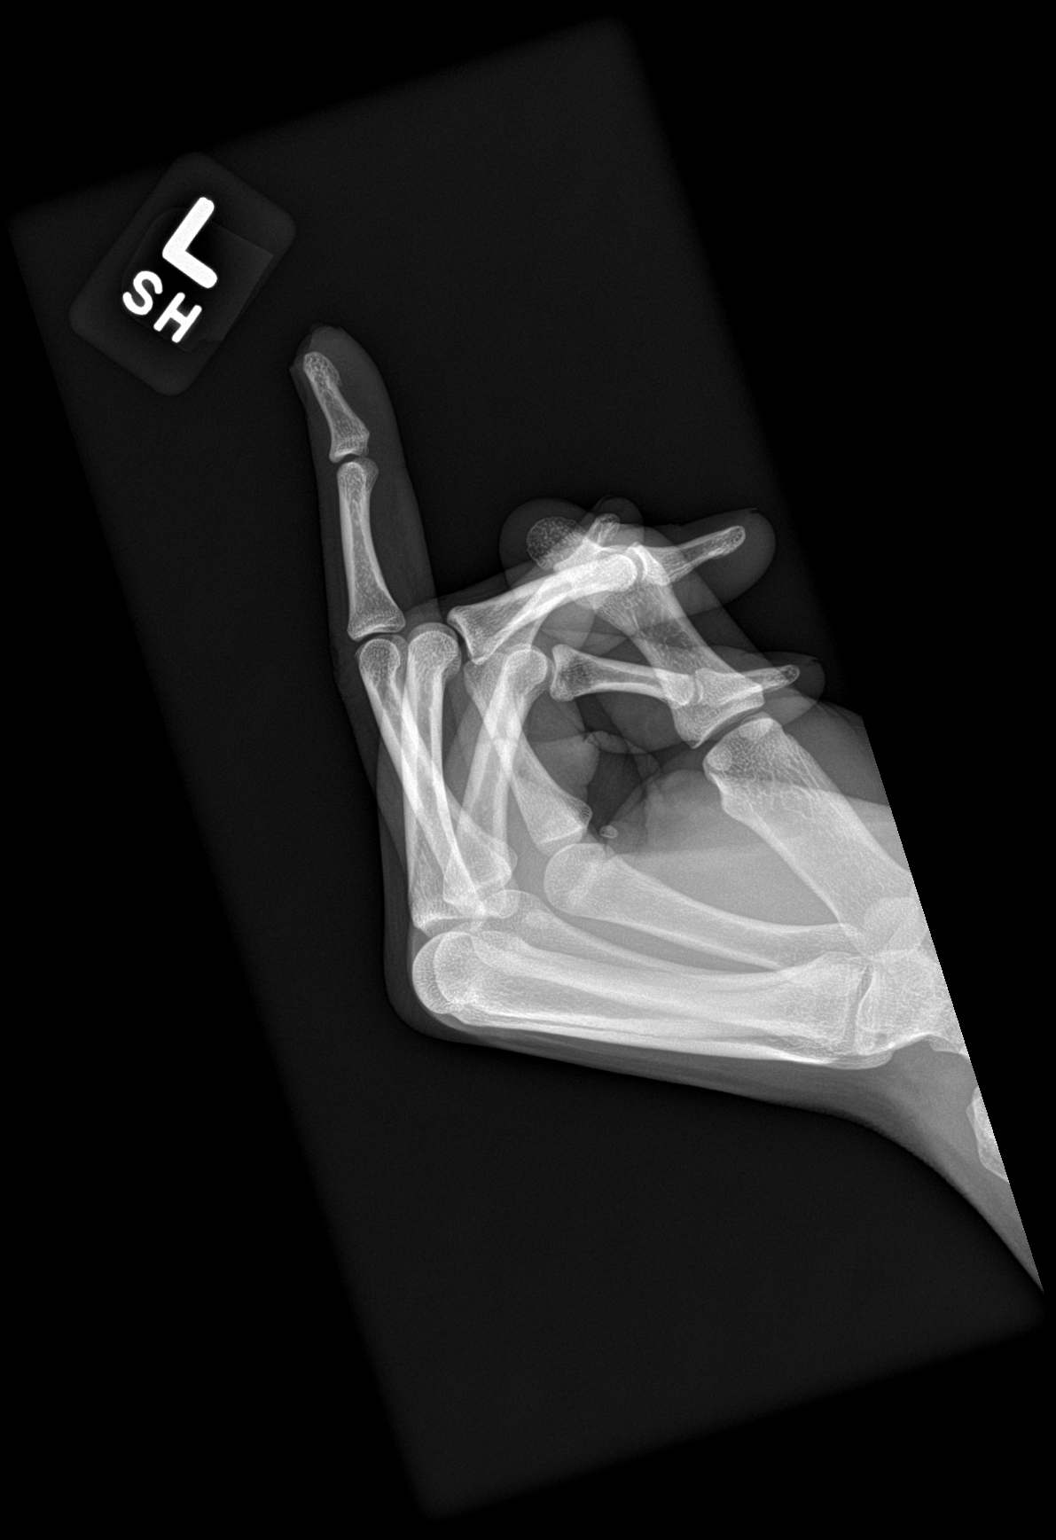

[finger ap]
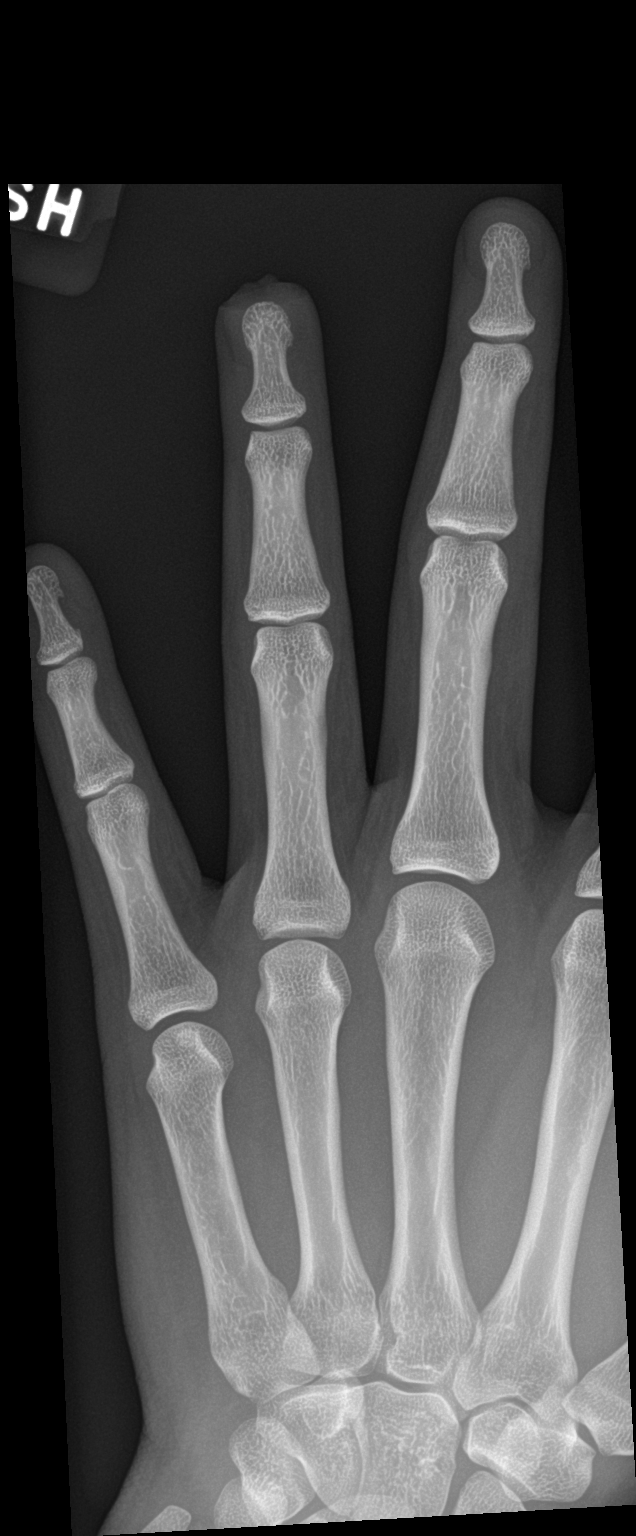

[3 of 3 positions shown; findings below may reference images not displayed]

FINDINGS: There is no evidence of fracture or dislocation. There is no
evidence of arthropathy or other focal bone abnormality. Distal
second digit soft tissue defect. The defect appears to extend to the
phalangeal tuft. No retained radiopaque foreign body.
IMPRESSION: 1. Distal second digit soft tissue defect. No retained radiopaque
foreign body.
2.  No acute displaced fracture or dislocation.
